# Patient Record
Sex: Male | Born: 1975
Health system: Southern US, Community
[De-identification: ages and names within clinical notes are randomized; demographics above are authoritative.]

## PROBLEM LIST (undated history)

## (undated) DIAGNOSIS — I10 Essential (primary) hypertension: Secondary | ICD-10-CM

## (undated) DIAGNOSIS — G1221 Amyotrophic lateral sclerosis: Secondary | ICD-10-CM

## (undated) DIAGNOSIS — E7800 Pure hypercholesterolemia, unspecified: Secondary | ICD-10-CM

## (undated) HISTORY — PX: APPENDECTOMY: SHX54

## (undated) HISTORY — DX: Essential (primary) hypertension: I10

## (undated) HISTORY — DX: Amyotrophic lateral sclerosis: G12.21

## (undated) HISTORY — DX: Pure hypercholesterolemia, unspecified: E78.00

---

## 2007-10-03 ENCOUNTER — Emergency Department (HOSPITAL_COMMUNITY): Admission: EM | Admit: 2007-10-03 | Discharge: 2007-10-03 | Payer: Self-pay | Admitting: Emergency Medicine

## 2009-06-15 ENCOUNTER — Encounter (INDEPENDENT_AMBULATORY_CARE_PROVIDER_SITE_OTHER): Payer: Self-pay | Admitting: General Surgery

## 2009-06-15 ENCOUNTER — Inpatient Hospital Stay (HOSPITAL_COMMUNITY): Admission: EM | Admit: 2009-06-15 | Discharge: 2009-06-16 | Payer: Self-pay | Admitting: Emergency Medicine

## 2010-04-19 LAB — COMPREHENSIVE METABOLIC PANEL
Alkaline Phosphatase: 73 U/L (ref 39–117)
CO2: 26 mEq/L (ref 19–32)
Calcium: 8.9 mg/dL (ref 8.4–10.5)
Chloride: 103 mEq/L (ref 96–112)
Creatinine, Ser: 1.05 mg/dL (ref 0.4–1.5)
Potassium: 3.9 mEq/L (ref 3.5–5.1)
Sodium: 138 mEq/L (ref 135–145)
Total Bilirubin: 1.2 mg/dL (ref 0.3–1.2)
Total Protein: 7.4 g/dL (ref 6.0–8.3)

## 2010-04-19 LAB — DIFFERENTIAL
Basophils Absolute: 0.1 10*3/uL (ref 0.0–0.1)
Eosinophils Relative: 9 % — ABNORMAL HIGH (ref 0–5)
Lymphocytes Relative: 14 % (ref 12–46)
Monocytes Relative: 5 % (ref 3–12)
Neutrophils Relative %: 71 % (ref 43–77)

## 2010-04-19 LAB — CBC
Hemoglobin: 13.1 g/dL (ref 13.0–17.0)
MCHC: 33.4 g/dL (ref 30.0–36.0)
MCHC: 33.5 g/dL (ref 30.0–36.0)
Platelets: 234 10*3/uL (ref 150–400)
RBC: 4.52 MIL/uL (ref 4.22–5.81)
RDW: 13.4 % (ref 11.5–15.5)
WBC: 10.5 10*3/uL (ref 4.0–10.5)

## 2010-04-19 LAB — URINALYSIS, ROUTINE W REFLEX MICROSCOPIC
Ketones, ur: NEGATIVE mg/dL
Leukocytes, UA: NEGATIVE
Urobilinogen, UA: 1 mg/dL (ref 0.0–1.0)
pH: 8 (ref 5.0–8.0)

## 2010-04-19 LAB — LIPASE, BLOOD: Lipase: 23 U/L (ref 11–59)

## 2010-04-19 LAB — URINE MICROSCOPIC-ADD ON

## 2010-11-03 LAB — URINALYSIS, ROUTINE W REFLEX MICROSCOPIC
Glucose, UA: NEGATIVE
Nitrite: NEGATIVE
Protein, ur: NEGATIVE
Urobilinogen, UA: 1

## 2010-11-03 LAB — RAPID STREP SCREEN (MED CTR MEBANE ONLY): Streptococcus, Group A Screen (Direct): NEGATIVE

## 2012-06-03 ENCOUNTER — Encounter (HOSPITAL_COMMUNITY): Payer: Self-pay | Admitting: *Deleted

## 2012-06-03 ENCOUNTER — Emergency Department (HOSPITAL_COMMUNITY)
Admission: EM | Admit: 2012-06-03 | Discharge: 2012-06-04 | Disposition: A | Discharge status: Home / Self Care | Payer: Self-pay | Attending: Emergency Medicine | Admitting: Emergency Medicine

## 2012-06-03 DIAGNOSIS — R300 Dysuria: Secondary | ICD-10-CM | POA: Insufficient documentation

## 2012-06-03 DIAGNOSIS — Z711 Person with feared health complaint in whom no diagnosis is made: Secondary | ICD-10-CM

## 2012-06-03 DIAGNOSIS — Z202 Contact with and (suspected) exposure to infections with a predominantly sexual mode of transmission: Secondary | ICD-10-CM | POA: Insufficient documentation

## 2012-06-03 DIAGNOSIS — R1030 Lower abdominal pain, unspecified: Secondary | ICD-10-CM

## 2012-06-03 DIAGNOSIS — R109 Unspecified abdominal pain: Secondary | ICD-10-CM | POA: Insufficient documentation

## 2012-06-03 LAB — COMPREHENSIVE METABOLIC PANEL
ALT: 31 U/L (ref 0–53)
AST: 21 U/L (ref 0–37)
Albumin: 4.4 g/dL (ref 3.5–5.2)
BUN: 12 mg/dL (ref 6–23)
Calcium: 9.8 mg/dL (ref 8.4–10.5)
Chloride: 103 mEq/L (ref 96–112)
GFR calc non Af Amer: >90 mL/min (ref >90)
Glucose, Bld: 144 mg/dL — ABNORMAL HIGH (ref 70–99)
Sodium: 139 mEq/L (ref 135–145)
Total Bilirubin: 0.3 mg/dL (ref 0.3–1.2)

## 2012-06-03 LAB — URINALYSIS, ROUTINE W REFLEX MICROSCOPIC
Glucose, UA: NEGATIVE mg/dL
Ketones, ur: NEGATIVE mg/dL
Nitrite: NEGATIVE
Protein, ur: NEGATIVE mg/dL
Specific Gravity, Urine: 1.017 (ref 1.005–1.030)

## 2012-06-03 LAB — CBC WITH DIFFERENTIAL/PLATELET
Eosinophils Relative: 2 % (ref 0–5)
HCT: 41 % (ref 39.0–52.0)
MCV: 83.3 fL (ref 78.0–100.0)
Monocytes Absolute: 0.4 10*3/uL (ref 0.1–1.0)
Monocytes Relative: 4 % (ref 3–12)
Neutro Abs: 5.9 10*3/uL (ref 1.7–7.7)
Platelets: 281 10*3/uL (ref 150–400)
WBC: 10.3 10*3/uL (ref 4.0–10.5)

## 2012-06-03 LAB — LIPASE, BLOOD: Lipase: 30 U/L (ref 11–59)

## 2012-06-03 NOTE — ED Notes (Signed)
C/o lower mid abd pain x 5 days; nausea; last bowel movement today--normal; pain with urination; denies penile discharge

## 2012-06-04 LAB — GC/CHLAMYDIA PROBE AMP: CT Probe RNA: NEGATIVE

## 2012-06-04 MED ORDER — CEFTRIAXONE SODIUM 250 MG IJ SOLR
250.0000 mg | Freq: Once | INTRAMUSCULAR | Status: AC
  Administered 2012-06-04: 250 mg via INTRAMUSCULAR
  Filled 2012-06-04: qty 250

## 2012-06-04 MED ORDER — AZITHROMYCIN 250 MG PO TABS
1000.0000 mg | ORAL_TABLET | Freq: Once | ORAL | Status: AC
  Administered 2012-06-04: 1000 mg via ORAL
  Filled 2012-06-04: qty 4

## 2012-06-04 NOTE — ED Provider Notes (Signed)
History     CSN: [id number]  Arrival date & time 06/03/12  2058   First MD Initiated Contact with Patient 06/04/12 0026      Chief Complaint  Patient presents with  . Abdominal Pain    (Consider location/radiation/quality/duration/timing/severity/associated sxs/prior treatment) HPI Patient emergency department complaining of dysuria and super pubic abdominal pain.  Onset of symptoms began approximately 5 days ago.  Patient denies any penile discharge but does report having unprotected sex about 2 weeks ago.  Patient denies any testicular pain, fevers, night sweats, chills, nausea, vomiting.  Symptoms are moderate.  Unknown exacerbating or alleviating factors. History reviewed. No pertinent past medical history.  Past Surgical History  Procedure Laterality Date  . Appendectomy      No family history on file.  History  Substance Use Topics  . Smoking status: Never Smoker   . Smokeless tobacco: Not on file  . Alcohol Use: Yes     Comment: social      Review of Systems  All other systems reviewed and are negative.    Allergies  Review of patient's allergies indicates no known allergies.  Home Medications  No current outpatient prescriptions on file.  BP 137/83  Pulse 76  Temp(Src) 99.3 F (37.4 C) (Oral)  Resp 20  Ht 5\' 3"  (1.6 m)  Wt 170 lb (77.111 kg)  BMI 30.12 kg/m2  SpO2 100%  Physical Exam  Nursing note and vitals reviewed. Constitutional: He is oriented to person, place, and time. He appears well-developed and well-nourished. No distress.  HENT:  Head: Normocephalic and atraumatic.  Eyes: Conjunctivae and EOM are normal.  Neck: Normal range of motion.  Cardiovascular:  Regular rate rhythm.  Pulmonary/Chest: Effort normal.  Abdominal:  Mild suprapubic abdominal pain.  No peritoneal signs.  Genitourinary:  Exam chaperoned.  Penis normal.  Uncircumcised.  GC Chlamydia cultures obtained.  No testicular swelling or tenderness to palpation.   Musculoskeletal: Normal range of motion.  Neurological: He is alert and oriented to person, place, and time.  Skin: Skin is warm and dry. No rash noted. He is not diaphoretic.  Psychiatric: He has a normal mood and affect. His behavior is normal.    ED Course  Procedures (including critical care time)  Labs Reviewed  COMPREHENSIVE METABOLIC PANEL - Abnormal; Notable for the following:    Glucose, Bld 144 (*)    All other components within normal limits  GC/CHLAMYDIA PROBE AMP  URINALYSIS, ROUTINE W REFLEX MICROSCOPIC  CBC WITH DIFFERENTIAL  LIPASE, BLOOD   No results found.   No diagnosis found.    MDM  Patient to be discharged with instructions to follow up with health department for further STD testing. Discussed importance of using protection when sexually active. Pt understands that they have GC/Chlamydia cultures pending and that they will need to inform all sexual partners if results return positive. Pt has been treated prophylacticly with azithromycin and rocephin due to pts history.  Labs reviewed without acute abnormalities.  Normal vital signs.  Strict return precautions discussed if pain worsened.        Jaci Carrel, [place] 06/04/12 [phone]

## 2012-06-04 NOTE — ED Provider Notes (Signed)
Medical screening examination/treatment/procedure(s) were performed by non-physician practitioner and as supervising physician I was immediately available for consultation/collaboration.  Doug Sou, MD 06/04/12 [phone]

## 2016-05-16 ENCOUNTER — Encounter (HOSPITAL_COMMUNITY): Payer: Self-pay | Admitting: Emergency Medicine

## 2016-05-16 DIAGNOSIS — N342 Other urethritis: Secondary | ICD-10-CM | POA: Insufficient documentation

## 2016-05-16 NOTE — ED Triage Notes (Signed)
Pt friend reporting that pt has been having urinary burning and discharge. Pt friend states that pt had been told to come get checked for STD. Burning began today and discharge 2 days ago.

## 2016-05-17 ENCOUNTER — Emergency Department (HOSPITAL_COMMUNITY)
Admission: EM | Admit: 2016-05-17 | Discharge: 2016-05-17 | Disposition: A | Discharge status: Home / Self Care | Payer: Self-pay | Attending: Emergency Medicine | Admitting: Emergency Medicine

## 2016-05-17 DIAGNOSIS — N342 Other urethritis: Secondary | ICD-10-CM

## 2016-05-17 LAB — GC/CHLAMYDIA PROBE AMP (~~LOC~~) NOT AT ARMC
Chlamydia: NEGATIVE
Neisseria Gonorrhea: NEGATIVE

## 2016-05-17 LAB — URINALYSIS, ROUTINE W REFLEX MICROSCOPIC
Bilirubin Urine: NEGATIVE
GLUCOSE, UA: NEGATIVE mg/dL
HGB URINE DIPSTICK: NEGATIVE
Ketones, ur: NEGATIVE mg/dL
LEUKOCYTES UA: NEGATIVE
Nitrite: NEGATIVE
PH: 5 (ref 5.0–8.0)
Protein, ur: NEGATIVE mg/dL
SPECIFIC GRAVITY, URINE: 1.023 (ref 1.005–1.030)

## 2016-05-17 MED ORDER — CEFTRIAXONE SODIUM 250 MG IJ SOLR
250.0000 mg | Freq: Once | INTRAMUSCULAR | Status: AC
Start: 2016-05-17 — End: 2016-05-17
  Administered 2016-05-17: 250 mg via INTRAMUSCULAR
  Filled 2016-05-17: qty 250

## 2016-05-17 MED ORDER — LIDOCAINE HCL 1 % IJ SOLN
INTRAMUSCULAR | Status: AC
  Administered 2016-05-17: 1.2 mL
  Filled 2016-05-17: qty 20

## 2016-05-17 MED ORDER — METRONIDAZOLE 500 MG PO TABS
2000.0000 mg | ORAL_TABLET | Freq: Once | ORAL | Status: AC
  Administered 2016-05-17: 2000 mg via ORAL
  Filled 2016-05-17: qty 4

## 2016-05-17 MED ORDER — AZITHROMYCIN 250 MG PO TABS
1000.0000 mg | ORAL_TABLET | Freq: Once | ORAL | Status: AC
  Administered 2016-05-17: 1000 mg via ORAL
  Filled 2016-05-17: qty 4

## 2016-05-17 MED ORDER — ONDANSETRON 8 MG PO TBDP
8.0000 mg | ORAL_TABLET | Freq: Once | ORAL | Status: AC
  Administered 2016-05-17: 8 mg via ORAL
  Filled 2016-05-17: qty 1

## 2016-05-17 NOTE — Discharge Instructions (Signed)
No intercourse for 1 week. We treated you today for possible trichomonas, gonorrhea, chlamydia.  Your urine analysis is normal. Follow up with health department.

## 2016-05-17 NOTE — ED Provider Notes (Signed)
[facility]-EMERGENCY DEPT Provider Note   CSN: [id number] Arrival date & time: 05/16/16  2253     History   Chief Complaint Chief Complaint  Patient presents with  . Dysuria    HPI Cody Fleming is a 41 y.o. male.  HPI Cody Fleming is a 41 y.o. male presents to ED with complaint of penile discharge, dysuria, urinary urgency. Symptoms began 2 days ago. No abdominal pain, pain in his penis, scrotum, no flank pain. No n/v. His girlfriend told him she got diagnosed with trichomonas. Pt is here to be treated.   History reviewed. No pertinent past medical history.  There are no active problems to display for this patient.   Past Surgical History:  Procedure Laterality Date  . APPENDECTOMY         Home Medications    Prior to Admission medications   Not on File    Family History History reviewed. No pertinent family history.  Social History Social History  Substance Use Topics  . Smoking status: Never Smoker  . Smokeless tobacco: Not on file  . Alcohol use Yes     Comment: social     Allergies   Patient has no known allergies.   Review of Systems Review of Systems  Constitutional: Negative for chills and fever.  Gastrointestinal: Negative for abdominal pain.  Genitourinary: Positive for discharge, dysuria and urgency. Negative for flank pain, frequency, genital sores, hematuria, penile pain and penile swelling.  All other systems reviewed and are negative.    Physical Exam Updated Vital Signs BP (!) 141/91 (BP Location: Left Arm)   Pulse 71   Temp 98.1 F (36.7 C) (Oral)   Resp 18   Ht  (1.6 m)   Wt 76.7 kg   SpO2 98%   BMI 29.94 kg/m   Physical Exam  Constitutional: He appears well-developed and well-nourished. No distress.  HENT:  Head: Normocephalic and atraumatic.  Eyes: Conjunctivae are normal.  Neck: Neck supple.  Cardiovascular: Normal rate, regular rhythm and normal heart sounds.   Pulmonary/Chest: Effort normal.  No respiratory distress. He has no wheezes. He has no rales.  Abdominal: Soft. Bowel sounds are normal. He exhibits no distension. There is no tenderness. There is no rebound.  Musculoskeletal: He exhibits no edema.  Neurological: He is alert.  Skin: Skin is warm and dry.  Nursing note and vitals reviewed.    ED Treatments / Results  Labs (all labs ordered are listed, but only abnormal results are displayed) Labs Reviewed  URINALYSIS, ROUTINE W REFLEX MICROSCOPIC    EKG  EKG Interpretation None       Radiology No results found.  Procedures Procedures (including critical care time)  Medications Ordered in ED Medications  ondansetron (ZOFRAN-ODT) disintegrating tablet 8 mg (8 mg Oral Given 05/17/16 0630)  metroNIDAZOLE (FLAGYL) tablet 2,000 mg (2,000 mg Oral Given 05/17/16 0629)  azithromycin (ZITHROMAX) tablet 1,000 mg (1,000 mg Oral Given 05/17/16 0630)  cefTRIAXone (ROCEPHIN) injection 250 mg (250 mg Intramuscular Given 05/17/16 7829)  lidocaine (XYLOCAINE) 1 % (with pres) injection (1.2 mLs  Given 05/17/16 5621)     Initial Impression / Assessment and Plan / ED Course  I have reviewed the triage vital signs and the nursing notes.  Pertinent labs & imaging results that were available during my care of the patient were reviewed by me and considered in my medical decision making (see chart for details).     Patient denies emergency department for penile discharge, dysuria, symptoms onset 2  days ago. Patient's girlfriend told him that she was diagnosed with trichomonas. Patient's urinalysis is normal. We will treat him for STI. He is not having any penile pain or swelling, no scrotum pain, no abdominal flank pain. No nausea or vomiting. No concern for epididymitis or pyelonephritis. Treated him with Rocephin 250 mg IM, Zithromax 1 g by mouth, Flagyl 2 g by mouth. Instructed to abstain from intercourse for one week, follow up with health department.  Vitals:   05/16/16 2306  05/16/16 2307 05/17/16 0621  BP: (!) 133/92  (!) 141/91  Pulse: 79  71  Resp: 18    Temp: 98.9 F (37.2 C)  98.1 F (36.7 C)  TempSrc: Oral  Oral  SpO2: 98%  98%  Weight:  76.7 kg   Height:   (1.6 m)      Final Clinical Impressions(s) / ED Diagnoses   Final diagnoses:  None    New Prescriptions New Prescriptions   No medications on file     Iona Coach 05/17/16 1610    April Palumbo, MD 05/17/16 [phone]

## 2016-05-17 NOTE — ED Notes (Signed)
Discharge instructions and follow up care reviewed with patient. Patient verbalized understanding. 

## 2017-05-10 ENCOUNTER — Ambulatory Visit: Payer: Self-pay | Admitting: Diagnostic Neuroimaging

## 2017-05-10 ENCOUNTER — Encounter: Payer: Self-pay | Admitting: Diagnostic Neuroimaging

## 2017-05-10 ENCOUNTER — Telehealth: Payer: Self-pay | Admitting: Diagnostic Neuroimaging

## 2017-05-10 DIAGNOSIS — G959 Disease of spinal cord, unspecified: Secondary | ICD-10-CM

## 2017-05-10 DIAGNOSIS — R29818 Other symptoms and signs involving the nervous system: Secondary | ICD-10-CM

## 2017-05-10 NOTE — Telephone Encounter (Signed)
self pay order sent ot [facility] they will contact the pt to schedule.

## 2017-05-10 NOTE — Patient Instructions (Signed)
-   check MRI brain scan

## 2017-05-10 NOTE — Progress Notes (Signed)
[facility]  PATIENT: Cody Fleming DOB: 1975-12-31  REFERRING CLINICIAN: Ishmael Holter HISTORY FROM: patient and friend (via interpreter) REASON FOR VISIT: new consult    HISTORICAL  CHIEF COMPLAINT:  Chief Complaint  Patient presents with  . LUE numbness, weakness    rm 6, New Pt, interpreter Peppe Delatore, friend- Judeth Cornfield, "problems with left arm/hand x 5 months; no history of injury"      HISTORY OF PRESENT ILLNESS:   42 year old male here for evaluation of left upper extremity numbness and weakness.  For past 5 months patient has had twitching in the left deltoid, which progressed down to the left hand.  Also with numbness and weakness in the left hand.  For past 1 year patient has had cramps in his left leg, with numbness and weakness.  He has some difficulty walking.  Patient is also had about a few months of blurred vision sensation.  He has some numbness on the left side of his face.    REVIEW OF SYSTEMS: Full 14 system review of systems performed and negative with exception of: Memory loss confusion headache numbness slurred speech dizziness tremor insomnia restless legs decreased energy joint pain cramps incontinence blurred vision palpitations impotence urination problems.  ALLERGIES: Allergies  Allergen Reactions  . Other Nausea And Vomiting    IV contrast dye    HOME MEDICATIONS: No outpatient medications prior to visit.   No facility-administered medications prior to visit.     PAST MEDICAL HISTORY: Past Medical History:  Diagnosis Date  . Hypertension     PAST SURGICAL HISTORY: Past Surgical History:  Procedure Laterality Date  . APPENDECTOMY      FAMILY HISTORY: Family History  Problem Relation Age of Onset  . Diabetes Mother     SOCIAL HISTORY:  Social History   Socioeconomic History  . Marital status: Single    Spouse name: Not on file  . Number of children: 0  . Years of education: 5  .  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 Financial resource strain: Not on file  . Food insecurity:    Worry: Not on file    Inability: Not on file  . Transportation needs:    Medical: Not on file    Non-medical: Not on file  Tobacco Use  . Smoking status: Never Smoker  . Smokeless tobacco: Never Used  . Tobacco comment: 05/10/17 rarely  Substance and Sexual Activity  . Alcohol use: Yes    Comment: social  . Drug use: Never  . Sexual activity: Not on file  Lifestyle  . Physical activity:    Days per week: Not on file    Minutes per session: Not on file  . Stress: Not on file  Relationships  . Social connections:    Talks on phone: Not on file    Gets together: Not on file    Attends religious service: Not on file    Active member of club or organization: Not on file    Attends meetings of clubs or organizations: Not on file    Relationship status: Not on file  . Intimate partner violence:    Fear of current or ex partner: Not on file    Emotionally abused: Not on file    Physically abused: Not on file    Forced sexual activity: Not on file  Other Topics Concern  . Not on file  Social  History Narrative   Lives sister   Restaurant cook, [employer]   Children none   Education- 5th grade      PHYSICAL EXAM   GENERAL EXAM/CONSTITUTIONAL: Vitals:  Vitals:   05/10/17 0845  BP: 137/90  Pulse: 74  Weight: 162 lb (73.5 kg)  Height: 5\' 3"  (1.6 m)     Body mass index is 28.7 kg/m.  Vision Screening Comments: Wears glasses, unable to complete screen  Patient is in no distress; well developed, nourished and groomed; neck is supple  CARDIOVASCULAR:  Examination of carotid arteries is normal; no carotid bruits  Regular rate and rhythm, no murmurs  Examination of peripheral vascular system by observation and palpation is normal  EYES:  Ophthalmoscopic exam of optic discs and  posterior segments is normal; no papilledema or hemorrhages  MUSCULOSKELETAL:  Gait, strength, tone, movements noted in Neurologic exam below  NEUROLOGIC: MENTAL STATUS:  No flowsheet data found.  awake, alert, oriented to person, place and time  recent and remote memory intact  normal attention and concentration  language fluent, comprehension intact, naming intact,   fund of knowledge appropriate  CRANIAL NERVE:   2nd - no papilledema on fundoscopic exam  2nd, 3rd, 4th, 6th - pupils equal and reactive to light, visual fields full to confrontation, extraocular muscles intact, no nystagmus  5th - facial sensation --> DECR PP IN LEFT FACE (V1, V2, V3)  7th - facial strength symmetric  8th - hearing intact  9th - palate elevates symmetrically, uvula midline  11th - shoulder shrug symmetric  12th - tongue protrusion midline  MOTOR:   normal bulk and tone, full strength in the RUE, RLE  LUE (DELTOID 4, BICEPT 4, TRICEP 3, FINGER ABDUCTION 2-3, GRIP 4, FINGER EXT 2); LEFT FDI ATROPHY  LLE HIP FLEX 4+, KNEE EXT/FLEX 4, DF 5  SENSORY:   normal and symmetric to light touch  DECR PP IN LUE AND LLE  DECR VIB IN LEFT FOOT  COORDINATION:   finger-nose-finger, fine finger movements --> DYSMETRIA WITH LUE  REFLEXES:   deep tendon reflexes present --> BRISK IN BUE AND BLE; LEFT MORE THAN RIGHT  POSITIVE PECTORAL, SUPRAPATELLAR REFLEXES  GAIT/STATION:   narrow based gait; SLIGHT LEFT HEMIPARETIC / SPASTIC GAIT    DIAGNOSTIC DATA (LABS, IMAGING, TESTING) - I reviewed patient records, labs, notes, testing and imaging myself where available.  Lab Results  Component Value Date   WBC 10.3 06/03/2012   HGB 14.0 06/03/2012   HCT 41.0 06/03/2012   MCV 83.3 06/03/2012   PLT 281 06/03/2012      Component Value Date/Time   NA 139 06/03/2012 2136   K 3.7 06/03/2012 2136   CL 103 06/03/2012 2136   CO2 28 06/03/2012 2136   GLUCOSE 144 (H) 06/03/2012 2136    BUN 12 06/03/2012 2136   CREATININE 0.95 06/03/2012 2136   CALCIUM 9.8 06/03/2012 2136   PROT 7.9 06/03/2012 2136   ALBUMIN 4.4 06/03/2012 2136   AST 21 06/03/2012 2136   ALT 31 06/03/2012 2136   ALKPHOS 81 06/03/2012 2136   BILITOT 0.3 06/03/2012 2136   GFRNONAA >90 06/03/2012 2136   GFRAA >90 06/03/2012 2136   No results found for: CHOL, HDL, LDLCALC, LDLDIRECT, TRIG, CHOLHDL No results found for: ZOXW9UHGBA1C No results found for: VITAMINB12 No results found for: TSH      ASSESSMENT AND PLAN  42 y.o. year old male here with at least one year of left leg numbness and weakness, 5  months of left arm numbness and weakness, subacute blurred vision, memory loss, incontinence, impotence over the past year.  Signs and symptoms concerning for central nervous system localization and multifocal process.  Recommend to check MRI of the brain to start workup.  And may consider MRI cervical and thoracic spine.   Localizations: right brain, spinal cord, optic nerve  Dx: CNS autoimmune, inflamm, vascular  1. Disease of spinal cord (HCC)   2. Other symptoms and signs involving the nervous system      PLAN:  - check MRI brain w/wo - then may consider MRI cervical / thoracic spine - then may consider additional lab testing  Orders Placed This Encounter  Procedures  . MR BRAIN W WO CONTRAST   Return in about 6 months (around 11/09/2017).    Suanne Marker, MD 05/10/2017, 9:42 AM Certified in Neurology, Neurophysiology and Neuroimaging  [facility] [address], [address] [place], [place] [address] [phone]

## 2017-05-17 ENCOUNTER — Other Ambulatory Visit: Payer: Self-pay

## 2017-05-20 ENCOUNTER — Other Ambulatory Visit: Payer: Self-pay

## 2017-05-24 ENCOUNTER — Other Ambulatory Visit: Payer: Self-pay

## 2017-05-31 ENCOUNTER — Ambulatory Visit
Admission: RE | Admit: 2017-05-31 | Discharge: 2017-05-31 | Disposition: A | Discharge status: Home / Self Care | Payer: No Typology Code available for payment source | Source: Ambulatory Visit | Attending: Diagnostic Neuroimaging | Admitting: Diagnostic Neuroimaging

## 2017-05-31 DIAGNOSIS — R202 Paresthesia of skin: Secondary | ICD-10-CM

## 2017-05-31 DIAGNOSIS — R29818 Other symptoms and signs involving the nervous system: Secondary | ICD-10-CM

## 2017-05-31 DIAGNOSIS — G959 Disease of spinal cord, unspecified: Secondary | ICD-10-CM

## 2017-05-31 MED ORDER — GADOBENATE DIMEGLUMINE 529 MG/ML IV SOLN
15.0000 mL | Freq: Once | INTRAVENOUS | Status: AC | PRN
  Administered 2017-05-31: 15 mL via INTRAVENOUS

## 2017-06-08 ENCOUNTER — Telehealth: Payer: Self-pay | Admitting: Neurology

## 2017-06-08 DIAGNOSIS — M5412 Radiculopathy, cervical region: Secondary | ICD-10-CM

## 2017-06-08 NOTE — Telephone Encounter (Signed)
Called to make pt aware that MRI c-spine was ordered. Voicemail not set up.

## 2017-06-08 NOTE — Telephone Encounter (Signed)
I ordered MRI cervical spine. -VRP 

## 2017-06-08 NOTE — Addendum Note (Signed)
Addended by: Joycelyn Schmid R on: 06/08/2017 04:43 PM   Modules accepted: Orders

## 2017-06-08 NOTE — Telephone Encounter (Signed)
Attempted to call the patient. His voicemail is not set up. Unable to leave a message. Will have to call again.

## 2017-06-08 NOTE — Telephone Encounter (Signed)
-----   Message from Suanne Marker, MD sent at 06/05/2017 12:49 PM EDT ----- Unremarkable imaging results. Please call patient. Continue current plan. May need to consider MRI cervical spine next. -VRP

## 2017-06-08 NOTE — Telephone Encounter (Signed)
Spoke with patient and discussed MRI results and plan of care. He handed the phone to his friend Judeth Cornfield for me to explain again and she translated to him. They are aware that the MRI brain is unremarkable and to continue current plan. Patient asked if his arm had anything to do with cervical spine. RN advised that would be the next thing to consider and he said that was fine per interpretation from friend.  They are aware that message will be passed to Dr. Marjory Lies to see if this should be ordered now or later. She verbalized understanding and appreciation.

## 2017-06-09 ENCOUNTER — Telehealth: Payer: Self-pay | Admitting: Diagnostic Neuroimaging

## 2017-06-09 NOTE — Telephone Encounter (Signed)
Attempted to call pt once more to inform him that MRI was ordered. Unable to reach and voicemail was not set up.

## 2017-06-09 NOTE — Telephone Encounter (Signed)
Called patient with [employer] spanish language and informed him that Dr. Marjory Lies ordered the MRI of his neck and someone else will be calling him to make the appointment. He verbalized understanding and appreciation.

## 2017-06-09 NOTE — Telephone Encounter (Signed)
self pay order sent to [facility]. They will reach out to the pt schedule.

## 2017-06-14 ENCOUNTER — Ambulatory Visit
Admission: RE | Admit: 2017-06-14 | Discharge: 2017-06-14 | Disposition: A | Discharge status: Home / Self Care | Payer: Self-pay | Source: Ambulatory Visit | Attending: Diagnostic Neuroimaging | Admitting: Diagnostic Neuroimaging

## 2017-06-14 DIAGNOSIS — M5412 Radiculopathy, cervical region: Secondary | ICD-10-CM

## 2017-06-14 MED ORDER — GADOBENATE DIMEGLUMINE 529 MG/ML IV SOLN
15.0000 mL | Freq: Once | INTRAVENOUS | Status: AC | PRN
  Administered 2017-06-14: 15 mL via INTRAVENOUS

## 2017-06-19 ENCOUNTER — Telehealth: Payer: Self-pay | Admitting: *Deleted

## 2017-06-19 NOTE — Telephone Encounter (Signed)
-----   Message from Suanne Marker, MD sent at 06/16/2017 12:54 PM EDT ----- Unremarkable imaging results. Please call patient. May consider EMG/NCS as next step. -VRP

## 2017-06-19 NOTE — Telephone Encounter (Signed)
Relayed both the MRI brain and cervical MRi as unremarkable. Next step [facility], made appt for 07-27-17 at 1315 arrive 1245,  These results and appt set up bu [place], [id number] [facility] interpreters.  Pt verbalized understanding.  If cancellation can call him to see if can come sooner.

## 2017-07-27 ENCOUNTER — Encounter: Payer: Self-pay | Admitting: Diagnostic Neuroimaging

## 2017-07-27 ENCOUNTER — Ambulatory Visit (INDEPENDENT_AMBULATORY_CARE_PROVIDER_SITE_OTHER): Payer: Self-pay | Admitting: Diagnostic Neuroimaging

## 2017-07-27 DIAGNOSIS — Z0289 Encounter for other administrative examinations: Secondary | ICD-10-CM

## 2017-07-27 DIAGNOSIS — M6281 Muscle weakness (generalized): Secondary | ICD-10-CM

## 2017-07-27 DIAGNOSIS — G589 Mononeuropathy, unspecified: Secondary | ICD-10-CM

## 2017-07-27 DIAGNOSIS — G6289 Other specified polyneuropathies: Secondary | ICD-10-CM

## 2017-07-27 NOTE — Progress Notes (Signed)
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 DG FLUORO GUIDED LOC OF NEEDLE/CATH TIP FOR SPINAL INJECT LT  . Aldolase  . CK  . Vitamin B12  . Hemoglobin A1c  . TSH  . Multiple Myeloma Panel (SPEP&IFE w/QIG)  . ANA,IFA RA Diag Pnl w/rflx Tit/Patn  . GM1 Antibody IgG, IgM  . GQ1b Antibody IgG    Penni Bombard, MD 05/25/9561, 8:75 PM Certified in Neurology, Neurophysiology and Neuroimaging  [facility] [address] [place], [place] [address] [phone]

## 2017-08-02 NOTE — Procedures (Signed)
[facility]  NCS (NERVE CONDUCTION STUDY) WITH EMG (ELECTROMYOGRAPHY) REPORT   STUDY DATE: 07/27/17 PATIENT NAME: Cody Fleming DOB: 04-12-1975 MRN: [id number]  ORDERING CLINICIAN: Joycelyn SchmidVikram Penumalli, MD   TECHNOLOGIST: Charlesetta IvoryBeau Handy  ELECTROMYOGRAPHER: Glenford BayleyVikram R. Penumalli, MD  CLINICAL INFORMATION: 42 year old male with left arm and left leg numbness and weakness. Hyper-reflexia noted.   FINDINGS: NERVE CONDUCTION STUDY:  Bilateral median and left ulnar motor responses of normal distal latencies, decreased amplitudes, normal conduction velocities.  Right ulnar motor response is normal.  Bilateral peroneal and tibial motor responses are normal.  Bilateral median, ulnar, superficial peroneal and sural sensory responses are normal.   NEEDLE ELECTROMYOGRAPHY:  Needle examination of bilateral upper and lower extremities, cervical, thoracic, lumbar paraspinal muscles, genioglossus, was performed.  Abnormal spontaneous activity noted in multiple muscles of bilateral upper and lower extremities including fibrillation potentials, positive sharp waves.  Fasciculations noted in the left deltoid, right flexor carpi radialis, left thoracic paraspinal muscles.  Neurogenic recruitment pattern noted in multiple muscles as an EMG table, in bilateral upper and lower extremity's as well as right genioglossus.  Myopathic recruitment pattern noted in left deltoid.    IMPRESSION:   Abnormal study demonstrating: - Bilateral median and left ulnar motor responses have decreased amplitudes. - Active and chronic denervation affecting bilateral upper and lower extremities. - Chronic denervation affecting right genioglossus. - Occasional fasciculations noted in bilateral upper extremities and left thoracic paraspinal muscles. - Mild myopathic recruitment pattern affecting left deltoid muscle. 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 Underlying myopathy is possible but less likely.      INTERPRETING PHYSICIAN:  Suanne MarkerVIKRAM R. PENUMALLI, MD Certified in Neurology, Neurophysiology and Neuroimaging  [facility] [address], [address] [place], [place] [address] [phone]   [facility]    Nerve / Sites Muscle Latency Ref. Amplitude Ref. Rel Amp Segments Distance Velocity Ref. Area    ms ms mV mV %  cm m/s m/s mVms  R Median - APB     Wrist APB 3.5 ?4.4 3.2 ?4.0 100 Wrist - APB 7   10.4     Upper arm APB 7.1  3.2  98.4 Upper arm - Wrist 20 56 ?49 10.6  L Median - APB     Wrist APB 3.4 ?4.4 2.5 ?4.0 100 Wrist - APB 7   7.3     Upper arm APB 7.1  2.4  94.4 Upper arm - Wrist 20 54 ?49 7.0  R Ulnar - ADM     Wrist ADM 2.6 ?3.3 6.5 ?6.0 100 Wrist - ADM 7   17.3     B.Elbow ADM 5.8  6.0  91.8 B.Elbow - Wrist 19 60 ?49 16.5     A.Elbow ADM 7.2  5.8  97.3 A.Elbow - B.Elbow 9 62 ?49 15.9         A.Elbow - Wrist      L Ulnar - ADM     Wrist ADM 2.7 ?3.3 2.6 ?6.0 100 Wrist - ADM 7   9.7     B.Elbow ADM 6.2  2.4  92.2 B.Elbow - Wrist 19 54 ?49 9.1     A.Elbow ADM 7.9  2.3  98.1 A.Elbow - B.Elbow 10 58 ?49 9.0         A.Elbow - Wrist      R Peroneal - EDB     Ankle EDB 4.0 ?6.5 2.4 ?2.0 100 Ankle - EDB  9   7.0     Fib head EDB 9.2  1.8  77.3 Fib head - Ankle 27 52 ?44 5.4     Pop fossa EDB 11.3  1.7  90.1 Pop fossa - Fib head 10 48 ?44 4.9     Acc Peron EDB      Pop fossa - Ankle             Acc Peron - Pop fossa      L Peroneal - EDB     Ankle EDB 5.2 ?6.5 5.9 ?2.0 100 Ankle - EDB 9   18.1     Fib head EDB 10.4  5.3  88.7 Fib head - Ankle 27 52 ?44 16.7     Pop fossa EDB 12.3  5.1  96.6 Pop fossa - Fib head 10 53 ?44 16.1         Pop fossa - Ankle      R Tibial - AH     Ankle AH 4.2 ?5.8 12.1 ?4.0 100 Ankle - AH 9   25.3     Pop fossa AH 11.3  9.0  74.6 Pop fossa - Ankle 33 46 ?41 20.7  L Tibial - AH     Ankle AH 4.1 ?5.8 15.3 ?4.0 100 Ankle - AH 9   30.5     Pop fossa AH 11.3  11.8   77.3 Pop fossa - Ankle 33 46 ?41 26.3                     SNC    Nerve / Sites Rec. Site Peak Lat Ref.  Amp Ref. Segments Distance    ms ms V V  cm  R Sural - Ankle (Calf)     Calf Ankle 3.5 ?4.4 17 ?6 Calf - Ankle 14  L Sural - Ankle (Calf)     Calf Ankle 3.8 ?4.4 16 ?6 Calf - Ankle 14  R Superficial peroneal - Ankle     Lat leg Ankle 3.6 ?4.4 18 ?6 Lat leg - Ankle 14  L Superficial peroneal - Ankle     Lat leg Ankle 3.8 ?4.4 16 ?6 Lat leg - Ankle 14  R Median - Orthodromic (Dig II, Mid palm)     Dig II Wrist 2.7 ?3.4 16 ?10 Dig II - Wrist 13  L Median - Orthodromic (Dig II, Mid palm)     Dig II Wrist 2.5 ?3.4 43 ?10 Dig II - Wrist 13  R Ulnar - Orthodromic, (Dig V, Mid palm)     Dig V Wrist 2.3 ?3.1 18 ?5 Dig V - Wrist 11  L Ulnar - Orthodromic, (Dig V, Mid palm)     Dig V Wrist 2.4 ?3.1 14 ?5 Dig V - Wrist 15                     F  Wave    Nerve F Lat Ref.   ms ms  R Tibial - AH 45.8 ?56.0  L Tibial - AH 44.8 ?56.0  R Ulnar - ADM 25.9 ?32.0  L Ulnar - ADM 28.3 ?32.0             EMG full       EMG Summary Table    Spontaneous MUAP Recruitment  Muscle IA Fib PSW Fasc Other Amp Dur. Poly Pattern  L. Deltoid Normal 1+ 1+ Occasional _______ Normal Normal 1+ Early  L. Biceps brachii Increased 2+ 2+  None CRDs Normal Normal 1+ Reduced  L. Triceps brachii Normal 1+ 1+ None _______ Increased Normal Normal Reduced  L. Flexor carpi radialis Normal 1+ 1+ None _______ Increased Normal Normal Reduced  L. First dorsal interosseous Normal 2+ 2+ None _______ Normal Normal Normal No Activity  L. Vastus medialis Increased None None None _______ Normal Normal Normal Reduced  L. Tibialis anterior Normal None None None _______ Normal Normal Normal Normal  L. Gastrocnemius (Medial head) Normal 1+ 1+ None _______ Normal Normal Normal Normal  R. Tibialis anterior Normal 2+ 2+ None _______ Increased Normal Normal Reduced  R. Gastrocnemius (Medial head) Normal 1+ 1+ None _______ Normal Normal  Normal Normal  R. Deltoid Normal 1+ 1+ None _______ Increased Normal Normal Reduced  R. Biceps brachii Normal 1+ 1+ None _______ Increased Normal Normal Reduced  R. Triceps brachii Normal 1+ 1+ Rare _______ Increased Normal Normal Reduced  R. Flexor carpi radialis Normal None None Occasional _______ Normal Normal Normal Normal  R. First dorsal interosseous Normal None None None _______ Normal Normal Normal Normal  R. Cervical paraspinals Normal None None Rare _______ Normal Normal Normal Normal  R. Thoracic paraspinals Normal None None Rare _______ Normal Normal Normal Normal  R. Lumbar paraspinals Normal 1+ None None _______ Normal Normal Normal Normal  L. Thoracic paraspinals Normal None None Occasional _______ Normal Normal Normal Normal  R. Genioglossus Normal None None None _______ Increased Normal Normal Reduced

## 2017-08-10 ENCOUNTER — Telehealth: Payer: Self-pay | Admitting: *Deleted

## 2017-08-10 ENCOUNTER — Ambulatory Visit
Admission: RE | Admit: 2017-08-10 | Discharge: 2017-08-10 | Disposition: A | Discharge status: Home / Self Care | Payer: Self-pay | Source: Ambulatory Visit | Attending: Diagnostic Neuroimaging | Admitting: Diagnostic Neuroimaging

## 2017-08-10 VITALS — BP 133/75 | HR 60

## 2017-08-10 DIAGNOSIS — M6281 Muscle weakness (generalized): Secondary | ICD-10-CM

## 2017-08-10 DIAGNOSIS — G6289 Other specified polyneuropathies: Secondary | ICD-10-CM

## 2017-08-10 NOTE — Discharge Instructions (Signed)
Punción lumbar  (Lumbar Puncture)  La punción lumbar es un procedimiento mediante el cual se extrae y examina una pequeña cantidad del líquido que rodea el cerebro y la médula espinal. Este líquido se llama líquido cefalorraquídeo. Este procedimiento se puede realizar para lo siguiente:  · Ayudar a diagnosticar varios problemas, como la meningitis, la encefalitis, la esclerosis múltiple y el sida.  · Drenar líquido y aliviar la presión que se presenta con ciertos tipos de cefaleas.  · Detectar hemorragias en las zonas del cerebro y la médula espinal (sistema nervioso central).  · Administrar medicamentos en el líquido cefalorraquídeo.  INFORME A SU MÉDICO:  · Cualquier alergia que tenga.  · Todos los medicamentos que utiliza, incluidos vitaminas, hierbas, gotas oftálmicas, cremas y medicamentos de venta libre.  · Problemas previos que usted o los miembros de su familia hayan tenido con el uso de anestésicos.  · Enfermedades de la sangre.  · Cirugías previas.  · Enfermedades.    RIESGOS Y COMPLICACIONES  En general, se trata de un procedimiento seguro. Sin embargo, como en cualquier procedimiento, pueden surgir complicaciones. Las complicaciones posibles son:  · Cefalea por punción lumbar. Es una cefalea grave que se presenta cuando hay pérdida de líquido cefalorraquídeo. Una cefalea por punción lumbar provoca malestar pero no es peligrosa. Si persiste, se puede realizar otro procedimiento para tratar la cefalea.  · Hemorragias. Generalmente ocurre en personas con trastornos hemorrágicos. Se trata de enfermedades en las que la sangre no coagula normalmente.  · Infección en el lugar de la inserción que se puede extender hasta el hueso o el líquido cefalorraquídeo.  · Formación de un tumor en la médula espinal (poco frecuente).  · Hernia cerebral o desplazamiento del cerebro a la médula espinal (poco frecuente).  · Imposibilidad para moverse (extremadamente raro).  ANTES DEL PROCEDIMIENTO  · Posiblemente deba  realizarse análisis de sangre. Estos análisis pueden ayudar a determinar la eficacia con la que están funcionando los riñones y el hígado. También pueden determinar la eficacia con la que coagula la sangre.  · Si toma anticoagulantes, pregúntele al médico si debe dejar de tomarlos y cuándo.  · El médico puede indicarle que se realice una tomografía computarizada (TC) del cerebro.  · Pídale a alguna persona que lo lleve a su casa después del procedimiento.    PROCEDIMIENTO  · Se lo colocará en una posición que permita que los espacios entre los huesos de la columna (vértebras) se separen todo lo posible. Esta posición facilitará el pasaje de la aguja hasta el canal espinal.  · Según su edad y tamaño, puede yacer de costado, con las rodillas cerca del mentón. O bien, puede sentarse con la cabeza sobre una almohada colocada a la altura de la cintura.  · Se limpiará la piel que recubre la parte inferior de la espalda (o región lumbar).  · Posiblemente se adormezca la piel con medicamentos.  · Probablemente le administren un medicamento para el dolor o para ayudarlo a relajarse (sedante).  · Luego se inserta una pequeña aguja, hasta que ingresa en el espacio que contiene el líquido cefalorraquídeo. La aguja no ingresará a la médula espinal.  · El líquido cefalorraquídeo se recolectará en tubos.  · Se retirará la aguja y se colocará un apósito en la zona.    DESPUÉS DEL PROCEDIMIENTO  · Permanecerá recostado durante 1 hora o durante el tiempo que sugiera el médico.  · El líquido cefalorraquídeo se enviará a un laboratorio para que sea examinado. Los resultados del   examen posiblemente estén listos antes de que se vaya a su casa.  · Si el médico cree que tiene una infección, se extraerá material del líquido cefalorraquídeo para hacer una prueba llamada cultivo. Si se extrajo un cultivo para exámenes, los resultados generalmente estarán listos en un par de días.    Esta información no tiene como fin reemplazar el consejo del  médico. Asegúrese de hacerle al médico cualquier pregunta que tenga.  Document Released: 10/27/2004 Document Revised: 11/07/2012 Document Reviewed: 09/24/2012  Elsevier Interactive Patient Education © 2017 Elsevier Inc.

## 2017-08-10 NOTE — Progress Notes (Signed)
Blood obtained from L AC for LP labs. Pt tolerated procedure well. Site is unremarkable. 

## 2017-08-10 NOTE — Telephone Encounter (Signed)
Received call [facility] about Preliminary CSF results, and  to fax final CSF results to [phone] unless critical will call.  [facility]. (See Epic labs)

## 2017-08-15 ENCOUNTER — Ambulatory Visit (HOSPITAL_COMMUNITY)
Admission: EM | Admit: 2017-08-15 | Discharge: 2017-08-15 | Disposition: A | Discharge status: Home / Self Care | Payer: Self-pay

## 2017-08-15 ENCOUNTER — Telehealth: Payer: Self-pay | Admitting: *Deleted

## 2017-08-15 ENCOUNTER — Other Ambulatory Visit: Payer: Self-pay

## 2017-08-15 ENCOUNTER — Encounter (HOSPITAL_COMMUNITY): Payer: Self-pay | Admitting: Emergency Medicine

## 2017-08-15 DIAGNOSIS — R51 Headache: Secondary | ICD-10-CM

## 2017-08-15 DIAGNOSIS — R519 Headache, unspecified: Secondary | ICD-10-CM

## 2017-08-15 NOTE — Telephone Encounter (Signed)
-----   Message from Vikram R Penumalli, MD sent at 08/14/2017  3:11 PM EDT ----- Slightly elevated CK (could represent muscle disease). Consider muscle biopsy [facility]. Please call patient. -VRP 

## 2017-08-15 NOTE — ED Provider Notes (Signed)
[facility]    CSN: [id number] Arrival date & time: 08/15/17  0801     History   Chief Complaint Chief Complaint  Patient presents with  . Headache    HPI Davide Thurmon is a 42 y.o. male with past medical history of hypertension..   Patient is a 42 year old male with a history of hypertension that presents with 3 days of headache post lumbar puncture that was performed on 08/10/2017.  The headache started Saturday with vomiting and continued into Sunday with vomiting.  He has had some blurred vision associated with it.  He has been taking ibuprofen to ease the pain and reports Monday and today the headache has gotten better.  He describes the pain as throbbing located in both of his temples and pulsating with mild photophobia.  He is currently being worked up by neurology for weakness and numbness in his left arm.  He has had MRI of the brain and cervical spine which were normal.  Lumbar puncture showed rare white blood cells.  He denies any fever, chills.  He is complaining of mild neck pain.  Denies stiffness in neck.  All this information was obtained using the translator.  ROS per HPI      Past Medical History:  Diagnosis Date  . Hypertension     There are no active problems to display for this patient.   Past Surgical History:  Procedure Laterality Date  . APPENDECTOMY         Home Medications    Prior to Admission medications   Not on File    Family History Family History  Problem Relation Age of Onset  . Diabetes Mother     Social History Social History   Tobacco Use  . Smoking status: Never Smoker  . Smokeless tobacco: Never Used  . Tobacco comment: 05/10/17 rarely  Substance Use Topics  . Alcohol use: Yes    Comment: social  . Drug use: Never     Allergies   Contrast media [iodinated diagnostic agents]   Review of Systems Review of Systems   Physical Exam Triage Vital Signs ED Triage Vitals  Enc Vitals Group    BP 08/15/17 0815 (!) 129/92     Pulse Rate 08/15/17 0815 69     Resp 08/15/17 0815 16     Temp 08/15/17 0815 97.9 F (36.6 C)     Temp Source 08/15/17 0815 Oral     SpO2 08/15/17 0815 100 %     Weight --      Height --      Head Circumference --      Peak Flow --      Pain Score 08/15/17 0821 8     Pain Loc --      Pain Edu? --      Excl. in GC? --    No data found.  Updated Vital Signs BP (!) 129/92 (BP Location: Left Arm)   Pulse 69   Temp 97.9 F (36.6 C) (Oral)   Resp 16   SpO2 100%   Visual Acuity Right Eye Distance:   Left Eye Distance:   Bilateral Distance:    Right Eye Near:   Left Eye Near:    Bilateral Near:     Physical Exam  Constitutional: He is oriented to person, place, and time. He appears well-developed and well-nourished.  HENT:  Head: Normocephalic and atraumatic.  Eyes: Pupils are equal, round, and reactive to light. EOM are normal.  Neck:  Normal range of motion. No neck rigidity. No Brudzinski's sign and no Kernig's sign noted.  Cardiovascular: Normal rate, regular rhythm and normal heart sounds.  Pulmonary/Chest: Effort normal and breath sounds normal.  Musculoskeletal:  Mild tenderness to palpation of cervical paravertebral muscles.   Lymphadenopathy:    He has no cervical adenopathy.  Neurological: He is alert and oriented to person, place, and time.  Weakness in left arm compared to right.  No facial droop, slurred speech.  No arm drift.  Decreased sensation in left arm.   Skin: Skin is warm and dry. Capillary refill takes less than 2 seconds.  Psychiatric: He has a normal mood and affect.  Nursing note and vitals reviewed.    UC Treatments / Results  Labs (all labs ordered are listed, but only abnormal results are displayed) Labs Reviewed - No data to display  EKG None  Radiology No results found.  Procedures Procedures (including critical care time)  Medications Ordered in UC Medications - No data to display  Initial  Impression / Assessment and Plan / UC Course  I have reviewed the triage vital signs and the nursing notes.  Pertinent labs & imaging results that were available during my care of the patient were reviewed by me and considered in my medical decision making (see chart for details).     Possible headache post lumbar puncture. Based on patient's history, chief complaint recommend patient call his neurologist ASAP and see what they recommend for further treatment.  If the headache gets worse or he develops any worsening symptoms recommend he go to the ER.  Focal weakness in left arm is chronic and currently being managed by neurology.  No other new focal deficits on exam.  Return precautions given. Final Clinical Impressions(s) / UC Diagnoses   Final diagnoses:  Acute intractable headache, unspecified headache type     Discharge Instructions     It was nice meeting you!!  Please call your neurologist ASAP and see what they recommend. If they are unable to see you or your symptoms worsen, please go to the ER.      ED Prescriptions    None     Controlled Substance Prescriptions [place] Controlled Substance Registry consulted? Not Applicable   Janace ArisBast, Charlestine Rookstool A, NP 08/15/17 1027

## 2017-08-15 NOTE — Discharge Instructions (Signed)
It was nice meeting you!!  Please call your neurologist ASAP and see what they recommend. If they are unable to see you or your symptoms worsen, please go to the ER.

## 2017-08-15 NOTE — Telephone Encounter (Signed)
Using [employer], [place] [phone] attempted to call pt and could not LVM.  Will attempt tomorrow.

## 2017-08-15 NOTE — ED Triage Notes (Signed)
Pt reports frontal head pain x3 days with fullness and hearing loss in his left ear.

## 2017-08-16 LAB — CSF CELL COUNT WITH DIFFERENTIAL
RBC COUNT CSF: 610 {cells}/uL — AB (ref 0–10)
WBC CSF: 1 {cells}/uL (ref 0–5)

## 2017-08-16 LAB — CSF CULTURE
MICRO NUMBER: 90822298
SPECIMEN QUALITY: ADEQUATE

## 2017-08-16 LAB — GLUCOSE, CSF: GLUCOSE CSF: 70 mg/dL (ref 40–80)

## 2017-08-16 LAB — CSF CULTURE W GRAM STAIN: Result:: NO GROWTH

## 2017-08-16 LAB — OLIGOCLONAL BANDS, CSF + SERM

## 2017-08-16 LAB — PROTEIN, CSF: Total Protein, CSF: 33 mg/dL (ref 15–45)

## 2017-08-16 NOTE — Telephone Encounter (Signed)
Attempted to call pt with [employer] Mardelle Mattendy  [phone]. No answer and no VM.  Will try tomorrow at an earlier time.

## 2017-08-17 ENCOUNTER — Other Ambulatory Visit: Payer: Self-pay | Admitting: *Deleted

## 2017-08-17 DIAGNOSIS — M6281 Muscle weakness (generalized): Secondary | ICD-10-CM

## 2017-08-17 DIAGNOSIS — G6289 Other specified polyneuropathies: Secondary | ICD-10-CM

## 2017-08-17 LAB — MULTIPLE MYELOMA PANEL, SERUM
ALBUMIN SERPL ELPH-MCNC: 4.4 g/dL (ref 2.9–4.4)
ALPHA 1: 0.2 g/dL (ref 0.0–0.4)
ALPHA2 GLOB SERPL ELPH-MCNC: 0.6 g/dL (ref 0.4–1.0)
Albumin/Glob SerPl: 1.5 (ref 0.7–1.7)
B-GLOBULIN SERPL ELPH-MCNC: 1.2 g/dL (ref 0.7–1.3)
GLOBULIN, TOTAL: 3.1 g/dL (ref 2.2–3.9)
Gamma Glob SerPl Elph-Mcnc: 1.1 g/dL (ref 0.4–1.8)
IGG (IMMUNOGLOBIN G), SERUM: 1195 mg/dL (ref 700–1600)
IgA/Immunoglobulin A, Serum: 198 mg/dL (ref 90–386)
IgM (Immunoglobulin M), Srm: 120 mg/dL (ref 20–172)
Total Protein: 7.5 g/dL (ref 6.0–8.5)

## 2017-08-17 LAB — GQ1B ANTIBODY IGG

## 2017-08-17 LAB — ANA,IFA RA DIAG PNL W/RFLX TIT/PATN
ANA Titer 1: NEGATIVE
CYCLIC CITRULLIN PEPTIDE AB: 4 U (ref 0–19)
Rhuematoid fact SerPl-aCnc: <10 IU/mL (ref 0.0–13.9)

## 2017-08-17 LAB — ALDOLASE: Aldolase: 9.8 U/L (ref 3.3–10.3)

## 2017-08-17 LAB — VITAMIN B12: Vitamin B-12: 1752 pg/mL — ABNORMAL HIGH (ref 232–1245)

## 2017-08-17 LAB — HEMOGLOBIN A1C
ESTIMATED AVERAGE GLUCOSE: 123 mg/dL
Hgb A1c MFr Bld: 5.9 % — ABNORMAL HIGH (ref 4.8–5.6)

## 2017-08-17 LAB — TSH: TSH: 1.27 u[IU]/mL (ref 0.450–4.500)

## 2017-08-17 LAB — GM1 ANTIBODY IGG, IGM
GM 1 IgG: <1:100 {titer}
GM 1 IgM: <1:100 {titer}

## 2017-08-17 LAB — CK: Total CK: 500 U/L — ABNORMAL HIGH (ref 24–204)

## 2017-08-17 NOTE — Telephone Encounter (Signed)
I spoke to pt thru [address] [employer] ([place]) [place], [phone].  Relayed to pt that CSF results were unremarkable, and lab results showed a slightly elevated CK (which could represent muscle disease), [facility]  Pt is ok to do this.   He is doing better these last 2 days.  Was seen in ED after had LP 08-15-17 (for nausea/vomiting, headache).  Otherwise no change in his sx from ofv visit. [facility] and pt is best time to call around 0930 and 1500 at [phone].  He has no VM.  Verified address.

## 2017-08-17 NOTE — Telephone Encounter (Signed)
-----   Message from Suanne MarkerVikram R Penumalli, MD sent at 08/14/2017  3:11 PM EDT ----- Slightly elevated CK (could represent muscle disease). [facility]. Please call patient. -VRP

## 2017-08-21 ENCOUNTER — Telehealth: Payer: Self-pay | Admitting: Diagnostic Neuroimaging

## 2017-08-21 NOTE — Telephone Encounter (Signed)
[facility] . To Att. Frederick Peerseresa Johnson Telephone [phone] - fax [phone] . Cody Fleming is aware and she will set up an interpreter . Interpreter will call patient to schedule.

## 2017-11-14 ENCOUNTER — Ambulatory Visit: Payer: Self-pay | Admitting: Diagnostic Neuroimaging

## 2017-11-14 ENCOUNTER — Telehealth: Payer: Self-pay | Admitting: *Deleted

## 2017-11-14 ENCOUNTER — Encounter: Payer: Self-pay | Admitting: Diagnostic Neuroimaging

## 2017-11-14 NOTE — Telephone Encounter (Signed)
Patient no-showed today's FU appointment. 

## 2018-05-30 IMAGING — MR MR CERVICAL SPINE WO/W CM
5 of 9 series · 24 of 48 positions shown · non-contrast
Comparison: none

[Series 3: T1 · sagittal · 3.0mm · 0.41mm/px · 4 of 13 slices shown (1 of 3)]
[im 1/13]
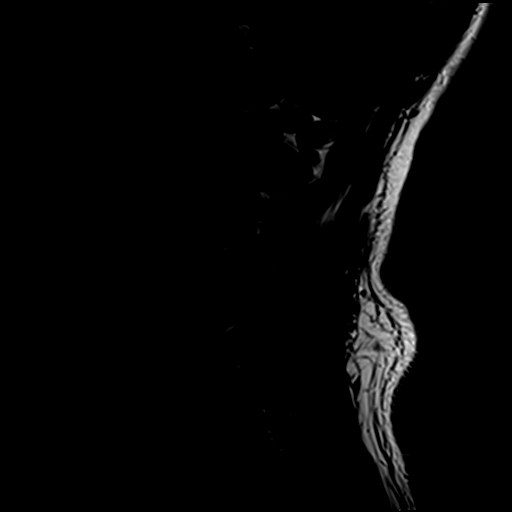
[im 5/13]
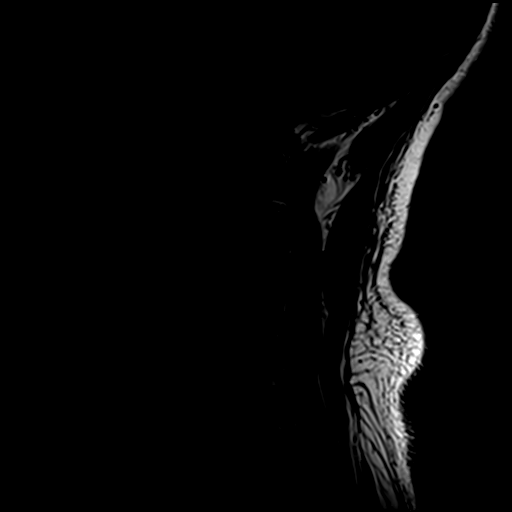
[im 9/13]
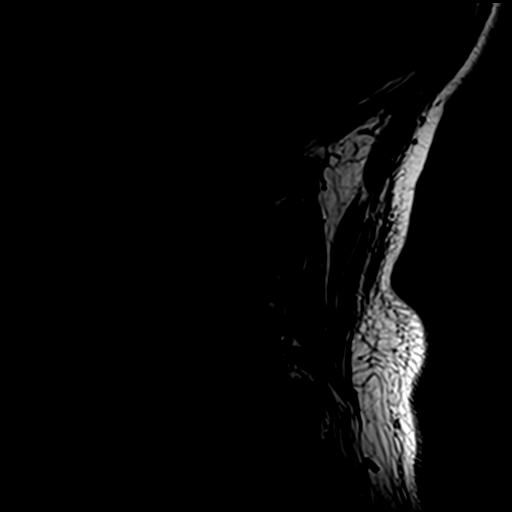
[im 13/13]
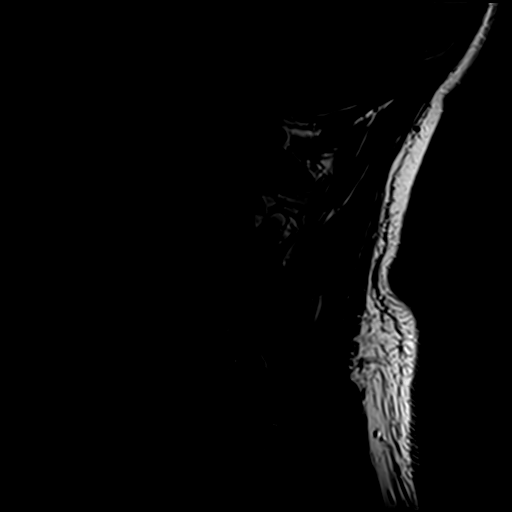

[Series 5: T2 · axial · 3.0mm · 0.70mm/px · z∈[-157,-63]mm · 6 of 23 slices shown (1 of 2)]
[im 1/23]
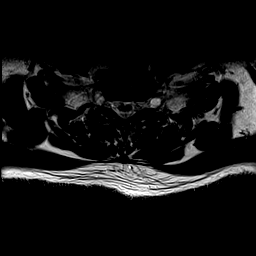
[im 5/23]
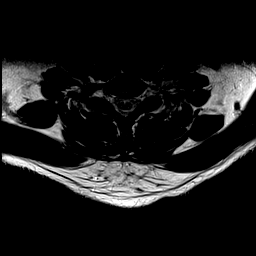
[im 9/23]
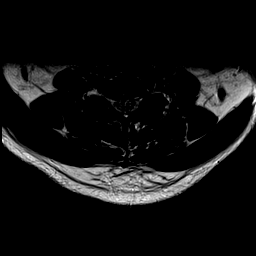
[im 14/23]
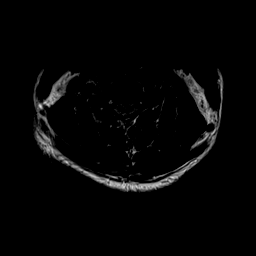
[im 18/23]
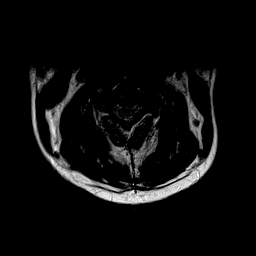
[im 23/23]
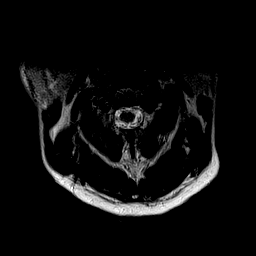

[Series 6: T1 · axial · 3.0mm · 0.35mm/px · z∈[-157,-63]mm · 7 of 26 slices shown (2 of 3)]
[im 1/26]
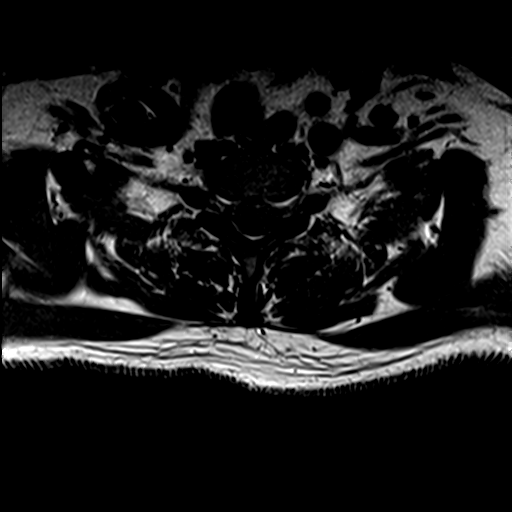
[im 5/26]
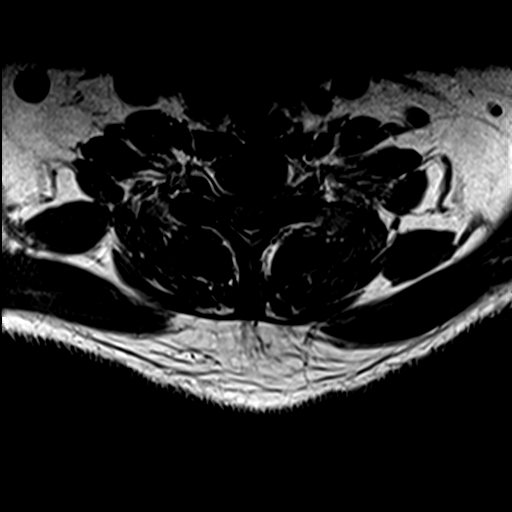
[im 9/26]
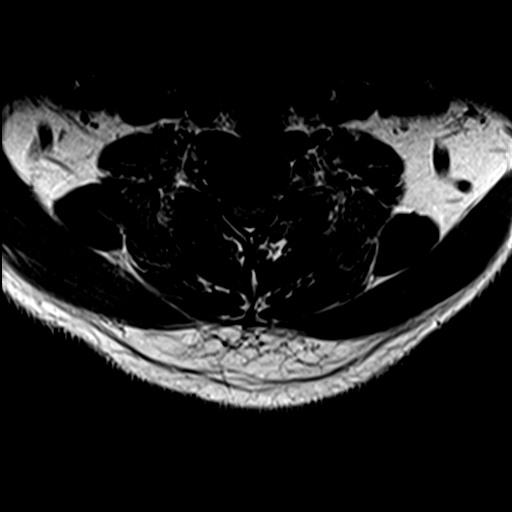
[im 13/26]
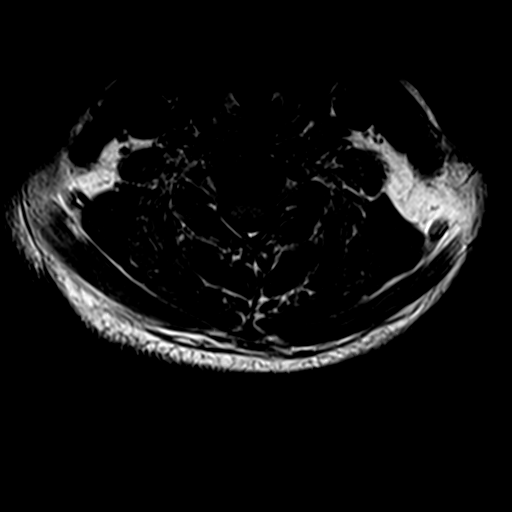
[im 17/26]
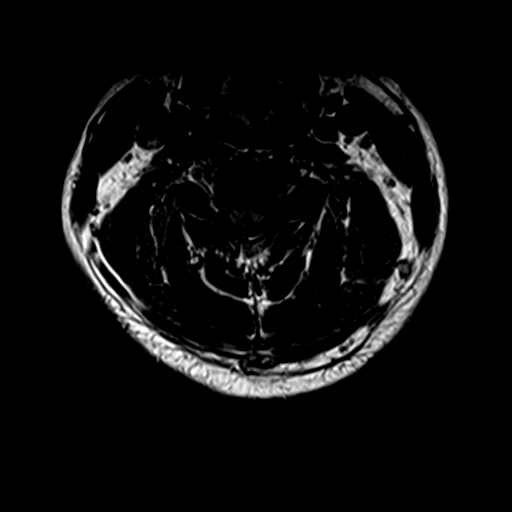
[im 21/26]
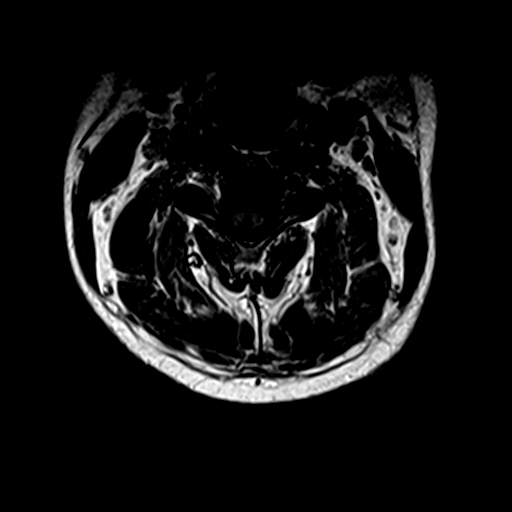
[im 26/26]
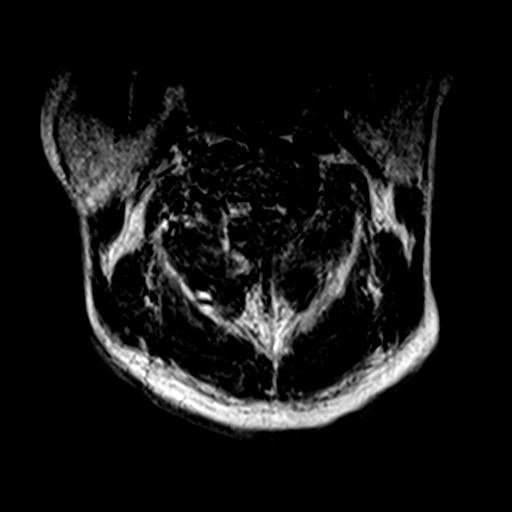

[Series 7: T1 · axial · 3.0mm · 0.35mm/px · z∈[-157,-112]mm · 4 of 26 slices shown (3 of 3)]
[im 1/26]
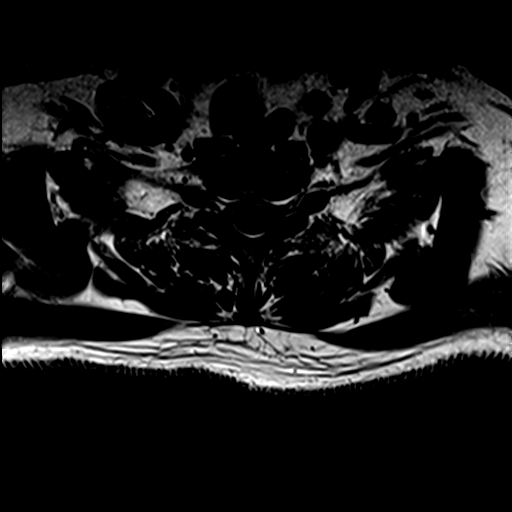
[im 5/26]
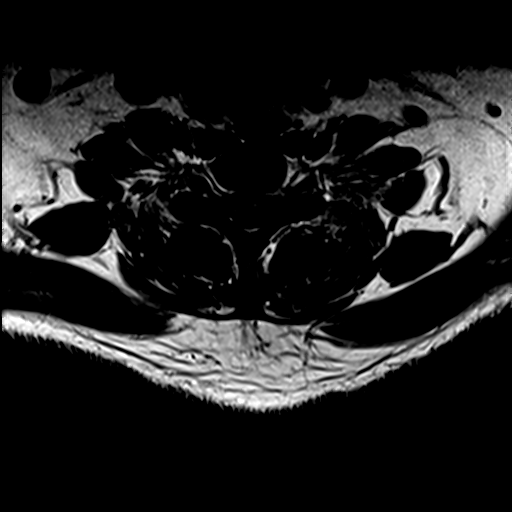
[im 9/26]
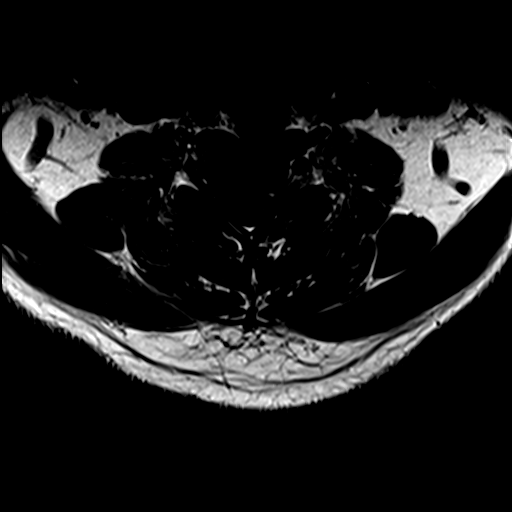
[im 13/26]
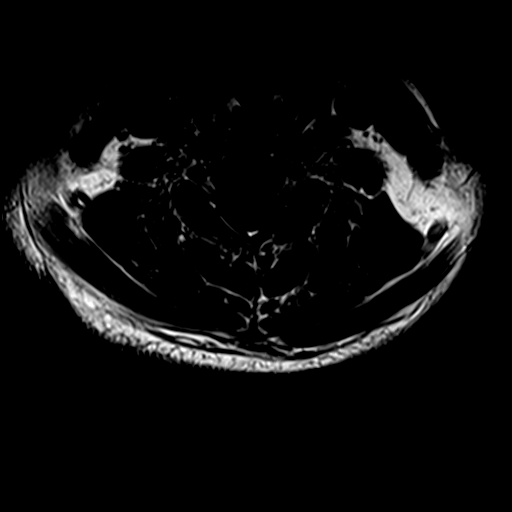

[Series 8: T2 · sagittal · 3.0mm · 0.41mm/px · 3 of 13 slices shown (2 of 2)]
[im 1/13]
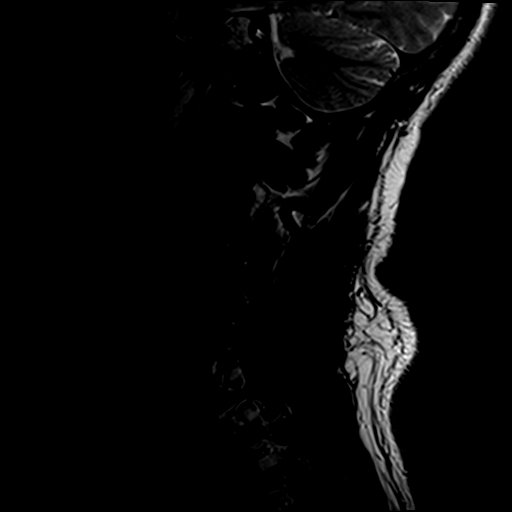
[im 7/13]
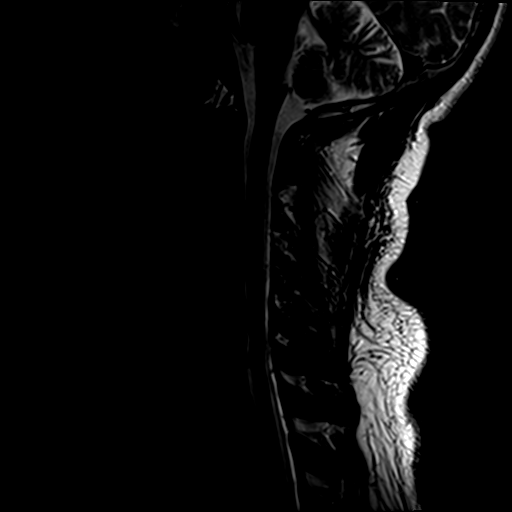
[im 13/13]
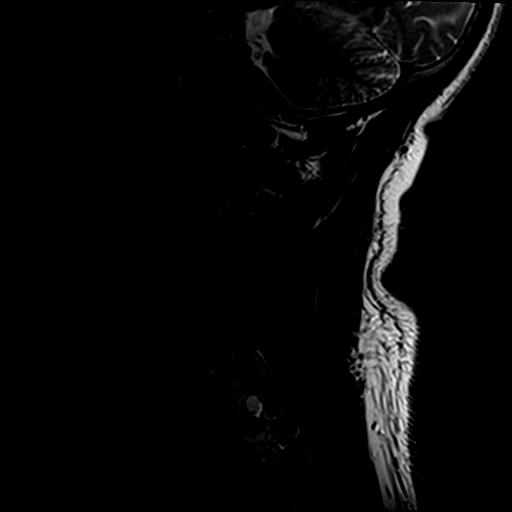

[24 of 48 positions shown; findings below may reference images not displayed]

Canned report from images found in remote index.

Refer to host system for actual result text.

## 2018-12-10 ENCOUNTER — Ambulatory Visit: Payer: Self-pay | Attending: Nurse Practitioner | Admitting: Nurse Practitioner

## 2018-12-10 ENCOUNTER — Other Ambulatory Visit: Payer: Self-pay

## 2018-12-10 ENCOUNTER — Encounter: Payer: Self-pay | Admitting: Nurse Practitioner

## 2018-12-10 DIAGNOSIS — G1221 Amyotrophic lateral sclerosis: Secondary | ICD-10-CM

## 2018-12-10 DIAGNOSIS — Z13220 Encounter for screening for lipoid disorders: Secondary | ICD-10-CM

## 2018-12-10 DIAGNOSIS — Z7689 Persons encountering health services in other specified circumstances: Secondary | ICD-10-CM

## 2018-12-10 DIAGNOSIS — Z8679 Personal history of other diseases of the circulatory system: Secondary | ICD-10-CM

## 2018-12-10 DIAGNOSIS — Z13228 Encounter for screening for other metabolic disorders: Secondary | ICD-10-CM

## 2018-12-10 DIAGNOSIS — Z130 Encounter for screening for diseases of the blood and blood-forming organs and certain disorders involving the immune mechanism: Secondary | ICD-10-CM

## 2018-12-10 DIAGNOSIS — M25512 Pain in left shoulder: Secondary | ICD-10-CM

## 2018-12-10 DIAGNOSIS — R7303 Prediabetes: Secondary | ICD-10-CM

## 2018-12-10 DIAGNOSIS — G8929 Other chronic pain: Secondary | ICD-10-CM

## 2018-12-10 DIAGNOSIS — M25511 Pain in right shoulder: Secondary | ICD-10-CM

## 2018-12-10 MED ORDER — CYCLOBENZAPRINE HCL 5 MG PO TABS: 5.0000 mg | ORAL_TABLET | Freq: Two times a day (BID) | ORAL | 1 refills | Status: AC | PRN

## 2018-12-10 MED ORDER — DICLOFENAC SODIUM 50 MG PO TBEC: 50.0000 mg | DELAYED_RELEASE_TABLET | Freq: Two times a day (BID) | ORAL | 1 refills | Status: AC

## 2018-12-10 MED FILL — CYCLOBENZAPRINE 5 MG TABLET: 5 | 30 days supply | Qty: 60 | Fill #0

## 2018-12-10 MED FILL — DICLOFENAC SOD EC 50 MG TAB: 50 | 30 days supply | Qty: 60 | Fill #0

## 2018-12-10 NOTE — Progress Notes (Signed)
Virtual Visit via Telephone Note Due to national recommendations of social distancing due to [name], telehealth visit is felt to be most appropriate for this patient at this time.  I discussed the limitations, risks, security and privacy concerns of performing an evaluation and management service by telephone and the availability of in person appointments. I also discussed with the patient that there may be a patient responsible charge related to this service. The patient expressed understanding and agreed to proceed.    I connected with Cody Fleming on 12/10/18  at   8:50 AM EST  EDT by telephone and verified that I am speaking with the correct person using two identifiers.   Consent I discussed the limitations, risks, security and privacy concerns of performing an evaluation and management service by telephone and the availability of in person appointments. I also discussed with the patient that there may be a patient responsible charge related to this service. The patient expressed understanding and agreed to proceed.   Location of Patient: Private Residence   Location of Provider: [place] and [place] participating in Telemedicine visit: [name] FNP-BC [facility]  [place] Interpreter ID# [name]   History of Present Illness: Telemedicine visit for: Establish Care  has a past medical history of ALS (amyotrophic lateral sclerosis) ([facility]) and Hypertension.  He has not followed up with Neurology or the [facility] clinic since last year. Wanting to establish care here however I instructed him as he is uninsured and has yet to complete the financial assistance packet he will need to continue to follow up at the [facility] in [place].  It is unclear why he stopped seeing them last year.  PER NEUROLOGY We have recommended Riluzole, however the patient cannot afford and given his immigration status he does not qualify for  goverment assistance. He will contact his family in [place] to discuss possibly obtaining this medication in his home country.   Patient lives with his girlfriend his sister and brother in a two level home. There are 4 steps to enter home (railing on one side). His bedroom and bathroom are on main level. He does not work but does drive. His brother works all days but Monday, sister is always there. Sister and girlfriend do the cooking.  Endorses chronic bilateral shoulder and hip pain and weakness. Can barely lift his arms in regard to putting on clothes, ADLs. etc. Will need to be referred back to [facility] as well as PT/OT.   Per last note from [name] dated 04-11-2018 Pt was scheduled for ALS clinic today, however, he did not show up nor did he call to cancel. Attempted to contact pt at # listed, no answer, unable to leave message, only getting a busy message. Will send pt a letter to contact the clinic at [phone] to schedule a f/u appt.   I have instructed him today he needs to call their office to schedule as soon as possible.   Essential Hypertension He is not currently taking any antihypertensives. Does not monitor blood pressure at home. Denies chest pain, shortness of breath, palpitations, lightheadedness, dizziness, headaches or BLE edema.  I am unable to locate any HTN medications on his profile.  BP Readings from Last 3 Encounters:  08/15/17 (!) 129/92  08/10/17 133/75  05/10/17 137/90    Past Medical History:  Diagnosis Date  . ALS (amyotrophic lateral sclerosis) ([facility])   . Hypertension  Past Surgical History:  Procedure Laterality Date  . APPENDECTOMY      Family History  Problem Relation Age of Onset  . Diabetes Mother     Social History   Socioeconomic History  . Marital status: Single    Spouse name: Not on file  . Number of children: 0  . Years of education: 5  . 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 Financial resource strain: Not on file  . Food insecurity    Worry: Not on file    Inability: Not on file  . Transportation needs    Medical: Not on file    Non-medical: Not on file  Tobacco Use  . Smoking status: Never Smoker  . Smokeless tobacco: Never Used  . Tobacco comment: 05/10/17 rarely  Substance and Sexual Activity  . Alcohol use: Yes    Comment: social  . Drug use: Never  . Sexual activity: Not on file  Lifestyle  . Physical activity    Days per week: Not on file    Minutes per session: Not on file  . Stress: Not on file  Relationships  . Social Herbalist on phone: Not on file    Gets together: Not on file    Attends religious service: Not on file    Active member of club or organization: Not on file    Attends meetings of clubs or organizations: Not on file    Relationship status: Not on file  Other Topics Concern  . 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  HENT: Negative.  Negative for nosebleeds.   Eyes: Negative.  Negative for blurred vision, double vision and photophobia.  Respiratory: Negative.  Negative for cough and shortness of breath.   Cardiovascular: Negative.  Negative for chest pain, palpitations and leg swelling.  Gastrointestinal: Negative.  Negative for heartburn, nausea and vomiting.  Musculoskeletal: Positive for myalgias.  Neurological: Positive for weakness. Negative for dizziness, focal weakness, seizures and headaches.  Psychiatric/Behavioral: Negative.  Negative for suicidal ideas.    Assessment and Plan: Diagnoses and all orders for this visit:  Encounter to establish care  ALS (amyotrophic lateral sclerosis) ([name]) -     Ambulatory  referral to Neurology  Chronic pain of both shoulders -     diclofenac (VOLTAREN) 50 MG EC tablet; Take 1 tablet (50 mg total) by mouth 2 (two) times daily. -     cyclobenzaprine (FLEXERIL) 5 MG tablet; Take 1 tablet (5 mg total) by mouth 2 (two) times daily as needed for muscle spasms.  Prediabetes -     A1c; Future Lab Results  Component Value Date   HGBA1C 5.9 (H) 07/27/2017    Lipid screening -     Lipid Panel; Future  Screening for metabolic disorder -     RUE45+WUJW; Future  Screening for deficiency anemia -     CBC; Future  History of hypertension DASH/Mediterranean Diets are healthier choices for HTN.     Follow Up Instructions Return in about 6 weeks (around 01/21/2019).     I discussed the assessment and treatment plan with the patient. The patient was provided an  opportunity to ask questions and all were answered. The patient agreed with the plan and demonstrated an understanding of the instructions.   The patient was advised to call back or seek an in-person evaluation if the symptoms worsen or if the condition fails to improve as anticipated.  I provided 26 minutes of non-face-to-face time during this encounter including median intraservice time, reviewing previous notes, labs, imaging, medications and explaining diagnosis and management.  Gildardo Pounds, FNP-BC

## 2018-12-10 NOTE — Progress Notes (Signed)
New Patient  Deep pain in both shoulder and lost strength in arms and movement in fingers. Per pt this started like in Jan or Feb.  Per pt he have a problem on the side of his buttocks

## 2018-12-11 ENCOUNTER — Ambulatory Visit: Payer: Self-pay | Attending: Nurse Practitioner

## 2018-12-11 DIAGNOSIS — Z130 Encounter for screening for diseases of the blood and blood-forming organs and certain disorders involving the immune mechanism: Secondary | ICD-10-CM

## 2018-12-11 DIAGNOSIS — Z13220 Encounter for screening for lipoid disorders: Secondary | ICD-10-CM

## 2018-12-11 DIAGNOSIS — R7303 Prediabetes: Secondary | ICD-10-CM

## 2018-12-11 DIAGNOSIS — Z13228 Encounter for screening for other metabolic disorders: Secondary | ICD-10-CM

## 2018-12-12 ENCOUNTER — Other Ambulatory Visit: Payer: Self-pay | Admitting: Nurse Practitioner

## 2018-12-12 LAB — HEMOGLOBIN A1C
Est. average glucose Bld gHb Est-mCnc: 128 mg/dL
Hgb A1c MFr Bld: 6.1 % — ABNORMAL HIGH (ref 4.8–5.6)

## 2018-12-12 LAB — CMP14+EGFR
ALT: 177 IU/L — ABNORMAL HIGH (ref 0–44)
AST: 85 IU/L — ABNORMAL HIGH (ref 0–40)
Albumin/Globulin Ratio: 1.6 (ref 1.2–2.2)
Albumin: 4.9 g/dL (ref 4.0–5.0)
Alkaline Phosphatase: 119 IU/L — ABNORMAL HIGH (ref 39–117)
BUN/Creatinine Ratio: 14 (ref 9–20)
BUN: 12 mg/dL (ref 6–24)
Bilirubin Total: 0.4 mg/dL (ref 0.0–1.2)
CO2: 22 mmol/L (ref 20–29)
Calcium: 10 mg/dL (ref 8.7–10.2)
Chloride: 104 mmol/L (ref 96–106)
Creatinine, Ser: 0.86 mg/dL (ref 0.76–1.27)
GFR calc Af Amer: 125 mL/min/{1.73_m2} (ref >59)
GFR calc non Af Amer: 108 mL/min/{1.73_m2} (ref >59)
Globulin, Total: 3 g/dL (ref 1.5–4.5)
Glucose: 94 mg/dL (ref 65–99)
Potassium: 4.7 mmol/L (ref 3.5–5.2)
Sodium: 142 mmol/L (ref 134–144)
Total Protein: 7.9 g/dL (ref 6.0–8.5)

## 2018-12-12 LAB — LIPID PANEL
Chol/HDL Ratio: 6.3 ratio — ABNORMAL HIGH (ref 0.0–5.0)
Cholesterol, Total: 215 mg/dL — ABNORMAL HIGH (ref 100–199)
HDL: 34 mg/dL — ABNORMAL LOW (ref >39)
LDL Chol Calc (NIH): 136 mg/dL — ABNORMAL HIGH (ref 0–99)
Triglycerides: 247 mg/dL — ABNORMAL HIGH (ref 0–149)
VLDL Cholesterol Cal: 45 mg/dL — ABNORMAL HIGH (ref 5–40)

## 2018-12-12 LAB — CBC
Hematocrit: 43.5 % (ref 37.5–51.0)
Hemoglobin: 14.6 g/dL (ref 13.0–17.7)
MCH: 28.5 pg (ref 26.6–33.0)
MCHC: 33.6 g/dL (ref 31.5–35.7)
MCV: 85 fL (ref 79–97)
Platelets: 328 10*3/uL (ref 150–450)
RBC: 5.12 x10E6/uL (ref 4.14–5.80)
RDW: 12.9 % (ref 11.6–15.4)
WBC: 13.2 10*3/uL — ABNORMAL HIGH (ref 3.4–10.8)

## 2018-12-12 MED ORDER — PRAVASTATIN SODIUM 20 MG PO TABS: 20.0000 mg | ORAL_TABLET | Freq: Every day | ORAL | 3 refills | Status: DC

## 2018-12-13 ENCOUNTER — Telehealth: Payer: Self-pay | Admitting: Nurse Practitioner

## 2018-12-13 NOTE — Telephone Encounter (Signed)
Patient returned nurse call regarding lab results. Please f/u  °

## 2018-12-17 NOTE — Telephone Encounter (Signed)
Patient called requesting his lab results. Please f/u  

## 2018-12-18 NOTE — Telephone Encounter (Signed)
Attempt to call patient to inform on results. No answer and LVM for a call back.

## 2018-12-18 NOTE — Telephone Encounter (Signed)
Patient called back. Pt. Was inform on lab results and scheduled for an appt in December with PCP.

## 2018-12-24 ENCOUNTER — Ambulatory Visit: Payer: Self-pay

## 2019-01-01 ENCOUNTER — Ambulatory Visit: Payer: Self-pay | Attending: Nurse Practitioner

## 2019-01-01 ENCOUNTER — Other Ambulatory Visit: Payer: Self-pay

## 2019-01-21 ENCOUNTER — Encounter: Payer: Self-pay | Admitting: Nurse Practitioner

## 2019-01-21 ENCOUNTER — Ambulatory Visit: Payer: Self-pay | Attending: Nurse Practitioner | Admitting: Nurse Practitioner

## 2019-01-21 ENCOUNTER — Other Ambulatory Visit: Payer: Self-pay

## 2019-01-21 DIAGNOSIS — G1221 Amyotrophic lateral sclerosis: Secondary | ICD-10-CM

## 2019-01-21 NOTE — Progress Notes (Signed)
Virtual Visit via Telephone Note Due to national recommendations of social distancing due to [name] 19, telehealth visit is felt to be most appropriate for this patient at this time.  I discussed the limitations, risks, security and privacy concerns of performing an evaluation and management service by telephone and the availability of in person appointments. I also discussed with the patient that there may be a patient responsible charge related to this service. The patient expressed understanding and agreed to proceed.    I connected with Proctor Montoro on 01/21/19  at   1:50 PM EST  EDT by telephone and verified that I am speaking with the correct person using two identifiers.   Consent I discussed the limitations, risks, security and privacy concerns of performing an evaluation and management service by telephone and the availability of in person appointments. I also discussed with the patient that there may be a patient responsible charge related to this service. The patient expressed understanding and agreed to proceed.   Location of Patient: Private Residence    Location of Provider: [place] and [place] participating in Telemedicine visit: [name] FNP-BC [place] Interpreter ID#    History of Present Illness: Telemedicine visit for: ALS symptoms  He has not heard back from the [facility] clinic. Referral was placed at his last office visit. He notes progressive numbness and tingling in bilateral hands and BLE. He will need to return to the [facility] clinic. Endorses chronic bilateral shoulder and hip pain and weakness. Can barely lift his arms in regard to putting on clothes, ADLs. etc.    He had previously been followed by [facility] for evaluation of his symptoms since at least April of 2019. His work up as consistent of a negative brain and C-spine MRI and normal serologic evaluation. In June of 2019 he  underwent a NCS/EMG which showed proximal and distal acute and chronic denervation in the proximal and distal upper extremity, the distal lower extremity and thoraic paraspinals. At this time he was sent to [facility] general Neurology for further evaluation and was seen by Dr. Vicente Serene, who feelt his presentation was consistent with ALS and referred him to [facility].   He has not followed up with Neurology or the [facility] clinic since last year. Wanting to establish care here however we do not treat ALS here in this clinic and he will need to continue to follow up at the [facility] in [place].  It is unclear why he stopped seeing them last year.   PER NEUROLOGY We have recommended Riluzole, however the patient cannot afford and given his immigration status he does not qualify for goverment assistance. He will contact his family in [place] to discuss possibly obtaining this medication in his home country.   Patient lives with his girlfriend his sister and brother in a two level home. There are 4 steps to enter home (railing on one side). His bedroom and bathroom are on main level. He does not work but does drive. His brother works all days but Monday, sister is always there. Sister and girlfriend do the cooking.   Per last note from [facility] clinic dated 04-11-2018 Pt was scheduled for ALS clinic today, however, he did not show up nor did he call to cancel. Attempted to contact pt at # listed, no answer, unable to leave message, only getting a busy message. Will send pt a letter to contact the clinic at  [phone] to schedule a f/u appt.    I will have the referral coordinator attempt to schedule him with the ALS clinic today.    Past Medical History:  Diagnosis Date  . ALS (amyotrophic lateral sclerosis) ([facility])   . Hypertension     Past Surgical History:  Procedure Laterality Date  . APPENDECTOMY      Family History  Problem Relation Age of Onset  . Diabetes Mother     Social History    Socioeconomic History  . Marital status: Single    Spouse name: Not on file  . Number of children: 0  . Years of education: 5  . 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 Smoking status: Never Smoker  . Smokeless tobacco: Never Used  . Tobacco comment: 05/10/17 rarely  Substance and Sexual Activity  . Alcohol use: Yes    Comment: social  . Drug use: Never  . Sexual activity: Not on file  Other Topics Concern  . 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 Difficulty of Paying Living Expenses: Not on file  Food Insecurity:   . Worried About Programme researcher, broadcasting/film/videounning Out of Food in the Last Year: Not on file  . Ran Out of Food in the Last Year: Not on file  Transportation Needs:   . Lack of Transportation (Medical): Not on file  . Lack of Transportation (Non-Medical): Not on file  Physical Activity:   . Days of Exercise per Week: Not on file  . Minutes of Exercise per Session: Not on file  Stress:   . Feeling of Stress : Not on file  Social Connections:   . Frequency of Communication with Friends and Family: Not on file  . Frequency of Social Gatherings with Friends and Family: Not on file  . Attends Religious Services: Not on file  . Active Member of Clubs or Organizations: Not on file  . Attends BankerClub or Organization Meetings: Not on file  . Marital Status: Not on file     Observations/Objective: Awake, alert and oriented x 3   Review of Systems  Constitutional: Negative for fever, malaise/fatigue and weight loss.  HENT: Negative.  Negative for nosebleeds.   Eyes: Negative.  Negative for blurred vision, double vision and photophobia.  Respiratory: Negative.  Negative for cough and shortness of breath.   Cardiovascular: Negative.  Negative for chest pain,  palpitations and leg swelling.  Gastrointestinal: Negative.  Negative for heartburn, nausea and vomiting.  Musculoskeletal: Positive for myalgias.  Neurological: Positive for sensory change and weakness. Negative for dizziness, focal weakness, seizures and headaches.  Psychiatric/Behavioral: Negative.  Negative for suicidal ideas.    Assessment and Plan: Rue was seen today for follow-up.  Diagnoses and all orders for this visit:  ALS (amyotrophic lateral sclerosis) ([facility])   I will have the referral coordinator attempt to schedule him with the ALS clinic today.   Follow Up Instructions Return if symptoms worsen or fail to improve.     I discussed the assessment and treatment plan with the patient. The patient was provided an opportunity to ask questions and all were answered. The patient agreed with the plan and demonstrated an understanding of the instructions.   The patient was advised to call  back or seek an in-person evaluation if the symptoms worsen or if the condition fails to improve as anticipated.  I provided 19 minutes of non-face-to-face time during this encounter including median intraservice time, reviewing previous notes, labs, imaging, medications and explaining diagnosis and management.  [name], FNP-BC

## 2019-02-22 ENCOUNTER — Encounter: Payer: Self-pay | Admitting: Nurse Practitioner

## 2019-02-22 ENCOUNTER — Ambulatory Visit: Payer: Self-pay | Attending: Nurse Practitioner | Admitting: Nurse Practitioner

## 2019-02-22 ENCOUNTER — Other Ambulatory Visit: Payer: Self-pay

## 2019-02-22 DIAGNOSIS — E785 Hyperlipidemia, unspecified: Secondary | ICD-10-CM

## 2019-02-22 DIAGNOSIS — D72829 Elevated white blood cell count, unspecified: Secondary | ICD-10-CM

## 2019-02-22 DIAGNOSIS — J069 Acute upper respiratory infection, unspecified: Secondary | ICD-10-CM

## 2019-02-22 DIAGNOSIS — R7989 Other specified abnormal findings of blood chemistry: Secondary | ICD-10-CM

## 2019-02-22 MED ORDER — FLUTICASONE PROPIONATE 50 MCG/ACT NA SUSP: 2.0000 | Freq: Every day | NASAL | 1 refills | Status: DC

## 2019-02-22 MED ORDER — BENZONATATE 200 MG PO CAPS: 200.0000 mg | ORAL_CAPSULE | Freq: Two times a day (BID) | ORAL | 0 refills | Status: DC | PRN

## 2019-02-22 MED ORDER — GUAIFENESIN-DM 100-10 MG/5ML PO SYRP: 5.0000 mL | ORAL_SOLUTION | ORAL | 0 refills | Status: DC | PRN

## 2019-02-22 MED ORDER — PRAVASTATIN SODIUM 20 MG PO TABS: 20.0000 mg | ORAL_TABLET | Freq: Every day | ORAL | 3 refills | Status: DC

## 2019-02-22 MED FILL — FLUTICASONE PROP 50 MCG SPR: 50 | 30 days supply | Qty: 16 | Fill #0

## 2019-02-22 MED FILL — ?PRAVASTATIN NA 20MG TABL: 20 | 30 days supply | Qty: 30 | Fill #0

## 2019-02-22 MED FILL — BENZONATATE 100 MG CAPS: 100 | 15 days supply | Qty: 60 | Fill #0

## 2019-02-22 NOTE — Progress Notes (Signed)
Virtual Visit via Telephone Note Due to national recommendations of social distancing due to [place] 19, telehealth visit is felt to be most appropriate for this patient at this time.  I discussed the limitations, risks, security and privacy concerns of performing an evaluation and management service by telephone and the availability of in person appointments. I also discussed with the patient that there may be a patient responsible charge related to this service. The patient expressed understanding and agreed to proceed.    I connected with Cody Fleming on 02/22/19  at  10:50 AM EST  EDT by telephone and verified that I am speaking with the correct person using two identifiers.   Consent I discussed the limitations, risks, security and privacy concerns of performing an evaluation and management service by telephone and the availability of in person appointments. I also discussed with the patient that there may be a patient responsible charge related to this service. The patient expressed understanding and agreed to proceed.   Location of Patient: Private Residence   Location of Provider: [place] and [place] participating in Telemedicine visit: Cody Rankins FNP-BC [place] Interpreter ID# [phone]   History of Present Illness: Telemedicine visit for: Upper Respiratory Symptoms   Upper Respiratory Illness Patient complains of URI. Symptoms include facial and nasal congestion, cough, plugged sensation in both ears and chest pain. Onset of symptoms was 1 week ago, unchanged since that time.  He is not drinking much. Evaluation to date: none. Treatment to date: none. He also endorses other sick contacts in the home. No one in the family has been tested for COVID.     ALS He still has not heard from the [facility] clinic. We are trying to get in contact with the ALS clinic for scheduling.   Past Medical History:   Diagnosis Date  . ALS (amyotrophic lateral sclerosis) ([facility])   . Hypertension     Past Surgical History:  Procedure Laterality Date  . APPENDECTOMY      Family History  Problem Relation Age of Onset  . Diabetes Mother     Social History   Socioeconomic History  . Marital status: Single    Spouse name: Not on file  . Number of children: 0  . Years of education: 5  . 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 Smoking status: Never Smoker  . Smokeless tobacco: Never Used  . Tobacco comment: 05/10/17 rarely  Substance and Sexual Activity  . Alcohol use: Yes    Comment: social  . Drug use: Never  . Sexual activity: Not on file  Other Topics Concern  . 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 Difficulty of Paying Living Expenses: Not on file  Food Insecurity:   . Worried About Charity fundraiser in the Last Year: Not on file  . Ran Out of Food in the Last Year: Not on file  Transportation Needs:   . Lack of Transportation (Medical): Not on file  . Lack of Transportation (Non-Medical): Not on file  Physical Activity:   . Days of Exercise per Week: Not on file  . Minutes of Exercise per Session: Not on file  Stress:   . Feeling of Stress : Not on file  Social Connections:   . Frequency of Communication with Friends and Family: Not on file  . Frequency of Social Gatherings with Friends and Family: Not on file  . Attends Religious Services: Not on file  . Active Member of Clubs or Organizations: Not on file  . Attends Banker Meetings: Not on file  . Marital Status: Not on file     Observations/Objective: Awake, alert and oriented x 3   Review of Systems  Constitutional: Positive for malaise/fatigue. Negative for chills and  fever.  HENT: Positive for congestion. Negative for ear discharge, ear pain, hearing loss, sinus pain and sore throat.        Ear pressure bilateral  Eyes: Negative.   Respiratory: Positive for cough. Negative for sputum production, shortness of breath and wheezing.   Cardiovascular: Positive for chest pain. Negative for orthopnea and leg swelling.  Gastrointestinal: Negative.  Negative for abdominal pain, diarrhea, nausea and vomiting.  Neurological: Positive for weakness and headaches. Negative for dizziness and focal weakness.  Endo/Heme/Allergies: Negative for environmental allergies.  Psychiatric/Behavioral: Negative.     Assessment and Plan: Cody Fleming was seen today for cough.  Diagnoses and all orders for this visit:  Viral upper respiratory tract infection -     benzonatate (TESSALON) 200 MG capsule; Take 1 capsule (200 mg total) by mouth 2 (two) times daily as needed for cough. -     fluticasone (FLONASE) 50 MCG/ACT nasal spray; Place 2 sprays into both nostrils daily. -     guaiFENesin-dextromethorphan (ROBITUSSIN DM) 100-10 MG/5ML syrup; Take 5 mLs by mouth every 4 (four) hours as needed for cough.  Dyslipidemia, goal LDL below 100 -     pravastatin (PRAVACHOL) 20 MG tablet; Take 1 tablet (20 mg total) by mouth daily. -     Lipid panel; Future  Leukocytosis, unspecified type -     CBC; Future  Elevated LFTs -     Hepatic Function Panel; Future -     Hepatitis C Antibody; Future     Follow Up Instructions Return in about 3 months (around 05/23/2019).     I discussed the assessment and treatment plan with the patient. The patient was provided an opportunity to ask questions and all were answered. The patient agreed with the plan and demonstrated an understanding of the instructions.   The patient was advised to call back or seek an in-person evaluation if the symptoms worsen or if the condition fails to improve as anticipated.  I provided 14 minutes of non-face-to-face  time during this encounter including median intraservice time, reviewing previous notes, labs, imaging, medications and explaining diagnosis and management.  [name], FNP-BC

## 2019-03-01 ENCOUNTER — Encounter: Payer: Self-pay | Admitting: Nurse Practitioner

## 2019-03-01 ENCOUNTER — Ambulatory Visit: Payer: Self-pay | Attending: Nurse Practitioner | Admitting: Nurse Practitioner

## 2019-03-01 DIAGNOSIS — M25362 Other instability, left knee: Secondary | ICD-10-CM

## 2019-03-01 NOTE — Progress Notes (Signed)
Virtual Visit via Telephone Note Due to national recommendations of social distancing due to [place] 19, telehealth visit is felt to be most appropriate for this patient at this time.  I discussed the limitations, risks, security and privacy concerns of performing an evaluation and management service by telephone and the availability of in person appointments. I also discussed with the patient that there may be a patient responsible charge related to this service. The patient expressed understanding and agreed to proceed.    I connected with Cody Fleming on 03/01/19  at   8:50 AM EST  EDT by telephone and verified that I am speaking with the correct person using two identifiers.   Consent I discussed the limitations, risks, security and privacy concerns of performing an evaluation and management service by telephone and the availability of in person appointments. I also discussed with the patient that there may be a patient responsible charge related to this service. The patient expressed understanding and agreed to proceed.   Location of Patient: Private Residence   Location of Provider: [place] [facility] [place] participating in Telemedicine visit: [name] FNP-BC [place] [id number]   History of Present Illness: Telemedicine visit for: Knee Instability  Left knee. States knee is cracking and feels loose when he walks. Denies any pain. Aggravating factors: walking. Onset of symptoms: 3-4 weeks ago. States he fell sometime last year on his left knee. He did not seek treatment at that time. There is no swelling in the knee.     Past Medical History:  Diagnosis Date  . ALS (amyotrophic lateral sclerosis) ([facility])   . Hypertension     Past Surgical History:  Procedure Laterality Date  . APPENDECTOMY      Family History  Problem Relation Age of Onset  . Diabetes Mother     Social History    Socioeconomic History  . Marital status: Single    Spouse name: Not on file  . Number of children: 0  . Years of education: 5  . 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 Smoking status: Never Smoker  . Smokeless tobacco: Never Used  . Tobacco comment: 05/10/17 rarely  Substance and Sexual Activity  . Alcohol use: Yes    Comment: social  . Drug use: Never  . Sexual activity: Not on file  Other Topics Concern  . 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 Difficulty of Paying Living Expenses: Not on file  Food Insecurity:   . Worried About Charity fundraiser in the Last Year: Not on file  . Ran Out of Food in the Last Year: Not on file  Transportation Needs:   . Lack of Transportation (Medical): Not on file  . Lack of Transportation (Non-Medical): Not on file  Physical Activity:   . Days of Exercise per Week: Not on file  . Minutes of Exercise per Session: Not on file  Stress:   . Feeling of Stress : Not on file  Social Connections:   . Frequency of Communication with Friends and Family: Not on file  . Frequency of Social Gatherings with Friends and Family: Not on file  . Attends Religious  Services: Not on file  . Active Member of Clubs or Organizations: Not on file  . Attends Banker Meetings: Not on file  . Marital Status: Not on file     Observations/Objective: Awake, alert and oriented x 3   Review of Systems  Constitutional: Negative for fever, malaise/fatigue and weight loss.  HENT: Negative.  Negative for nosebleeds.   Eyes: Negative.  Negative for blurred vision, double vision and photophobia.  Respiratory: Negative.  Negative for cough and shortness of breath.   Cardiovascular: Negative.  Negative for chest pain,  palpitations and leg swelling.  Gastrointestinal: Negative.  Negative for heartburn, nausea and vomiting.  Musculoskeletal: Negative for myalgias.       SEE HPI  Neurological: Negative.  Negative for dizziness, focal weakness, seizures and headaches.  Psychiatric/Behavioral: Negative.  Negative for suicidal ideas.    Assessment and Plan: Timothee was seen today for knee instability  Diagnoses and all orders for this visit:  Knee instability, left -     DG Knee Complete 4 Views Left; Future Wear a knee sleeve/brace with ambulation/mobility   Follow Up Instructions Return if symptoms worsen or fail to improve.     I discussed the assessment and treatment plan with the patient. The patient was provided an opportunity to ask questions and all were answered. The patient agreed with the plan and demonstrated an understanding of the instructions.   The patient was advised to call back or seek an in-person evaluation if the symptoms worsen or if the condition fails to improve as anticipated.  I provided 17 minutes of non-face-to-face time during this encounter including median intraservice time, reviewing previous notes, labs, imaging, medications and explaining diagnosis and management.  [name], FNP-BC

## 2019-04-02 ENCOUNTER — Other Ambulatory Visit: Payer: Self-pay

## 2019-04-02 ENCOUNTER — Encounter: Payer: Self-pay | Admitting: Nurse Practitioner

## 2019-04-02 ENCOUNTER — Ambulatory Visit: Payer: Self-pay | Attending: Nurse Practitioner | Admitting: Nurse Practitioner

## 2019-04-02 VITALS — BP 138/93 | HR 73 | Temp 99.9°F | Ht 64.0 in | Wt 167.0 lb

## 2019-04-02 DIAGNOSIS — G8929 Other chronic pain: Secondary | ICD-10-CM

## 2019-04-02 DIAGNOSIS — M25512 Pain in left shoulder: Secondary | ICD-10-CM

## 2019-04-02 DIAGNOSIS — R7989 Other specified abnormal findings of blood chemistry: Secondary | ICD-10-CM

## 2019-04-02 DIAGNOSIS — M25511 Pain in right shoulder: Secondary | ICD-10-CM

## 2019-04-02 DIAGNOSIS — R350 Frequency of micturition: Secondary | ICD-10-CM

## 2019-04-02 DIAGNOSIS — N399 Disorder of urinary system, unspecified: Secondary | ICD-10-CM

## 2019-04-02 DIAGNOSIS — E785 Hyperlipidemia, unspecified: Secondary | ICD-10-CM

## 2019-04-02 DIAGNOSIS — D72829 Elevated white blood cell count, unspecified: Secondary | ICD-10-CM

## 2019-04-02 DIAGNOSIS — M25562 Pain in left knee: Secondary | ICD-10-CM

## 2019-04-02 LAB — POCT URINALYSIS DIP (CLINITEK)
Bilirubin, UA: NEGATIVE
Blood, UA: NEGATIVE
Glucose, UA: NEGATIVE mg/dL
Ketones, POC UA: NEGATIVE mg/dL
Leukocytes, UA: NEGATIVE
Nitrite, UA: NEGATIVE
POC PROTEIN,UA: NEGATIVE
Spec Grav, UA: 1.02 (ref 1.010–1.025)
Urobilinogen, UA: 0.2 E.U./dL
pH, UA: 5.5 (ref 5.0–8.0)

## 2019-04-02 MED ORDER — METHOCARBAMOL 500 MG PO TABS: 500.0000 mg | ORAL_TABLET | Freq: Four times a day (QID) | ORAL | 0 refills | Status: DC

## 2019-04-02 MED ORDER — MELOXICAM 7.5 MG PO TABS: 7.5000 mg | ORAL_TABLET | Freq: Every day | ORAL | 0 refills | Status: DC

## 2019-04-02 MED FILL — MELOXICAM 7.5 MG TABLET: 7.5 | 30 days supply | Qty: 30 | Fill #0

## 2019-04-02 MED FILL — METHOCARBAMOL 500 MG TABS: 500 | 15 days supply | Qty: 60 | Fill #0

## 2019-04-02 NOTE — Patient Instructions (Addendum)
[facility] APPT!!!!!!!!  MARCH 17th AT 9 am   ARRIVE at [address]   [facility] Okey Dupre [address] [place], [place] [address] Ph# [phone] Fax #  [phone]

## 2019-04-02 NOTE — Progress Notes (Signed)
Assessment & Plan:  Cody Fleming was seen today for arm pain and urinary frequency.  Diagnoses and all orders for this visit:  Urinary problem in male -     POCT URINALYSIS DIP (CLINITEK)  Increased urinary frequency -     Hemoglobin A1c; Future -     Hemoglobin A1c  Chronic pain of left knee -     meloxicam (MOBIC) 7.5 MG tablet; Take 1 tablet (7.5 mg total) by mouth daily. -     methocarbamol (ROBAXIN) 500 MG tablet; Take 1 tablet (500 mg total) by mouth 4 (four) times daily. -     DG Knee Complete 4 Views Left; Future  Acute pain of both shoulders -     meloxicam (MOBIC) 7.5 MG tablet; Take 1 tablet (7.5 mg total) by mouth daily. -     methocarbamol (ROBAXIN) 500 MG tablet; Take 1 tablet (500 mg total) by mouth 4 (four) times daily.  Elevated LFTs -     Hepatitis C Antibody -     Hepatic Function Panel  Dyslipidemia, goal LDL below 100 -     Lipid panel  Leukocytosis, unspecified type -     CBC    Patient has been counseled on age-appropriate routine health concerns for screening and prevention. These are reviewed and up-to-date. Referrals have been placed accordingly. Immunizations are up-to-date or declined.    Subjective:   Chief Complaint  Patient presents with  . Arm Pain    Pt. is having pain on both of his arms.  . Urinary Frequency    Pt. is not having pain when he urinating  Pt. stated he cannot hold his urine long enough.    HPI Cody Fleming 44 y.o. male presents to office today for follow up.  VRI was used to communicate directly with patient for the entire encounter including providing detailed patient instructions.    We were able to get him an appointment at the [facility] on march 17th at [place]. He was instructed not to miss this appointment.    GU Problem This has been present for a few months. He  describes the symptoms as  urge to urinate with little or no warning as well as urinary frequency.  Factors associated with symptoms include  suspected ALS. Evaluation to date includes none.Treatment to date includes none. It comes and goes. More frequent in the afternoon. Whenever he drinks water or any liquids he experiences a strong urge to urinate.  He denies any accidental leakage of urine.   Bilateral Shoulder Pain Started exercising (pulling ropes) and began to notice bilateral shoulder pain. Described as tightness as well. Pain is worse in the left shoulder. This is evaluated as a personal injury.  States he has been working out and pulling ropes for exercise lately. The pain is described as aching and tight band.  The onset of the pain was several days ago.  The pain occurs when active.  Location is global. No history of dislocation. Symptoms are aggravated by reaching, lifting, pulling, pushing, twisting, repetitive use, exercise. Symptoms are diminished by  rest.   Limited activities include: no limitations.   Left Knee Pain Chronic. Inciting event: none known. Current symptoms include crepitus sensation, popping sensation and stiffness. Pain is aggravated by bending, squatting.  Patient has had prior knee problems. Evaluation to date: none. Treatment to date: none.    Review of Systems  Constitutional: Negative for fever, malaise/fatigue and weight loss.  HENT: Negative.  Negative for  nosebleeds.   Eyes: Negative.  Negative for blurred vision, double vision and photophobia.  Respiratory: Negative.  Negative for cough and shortness of breath.   Cardiovascular: Negative.  Negative for chest pain, palpitations and leg swelling.  Gastrointestinal: Negative.  Negative for heartburn, nausea and vomiting.  Genitourinary: Positive for frequency.  Musculoskeletal: Positive for joint pain. Negative for myalgias.  Neurological: Positive for weakness. Negative for dizziness, focal weakness, seizures and headaches.  Psychiatric/Behavioral: Negative.  Negative for suicidal ideas.     Past Medical History:  Diagnosis Date  . ALS  (amyotrophic lateral sclerosis) ([facility])   . Hypertension     Past Surgical History:  Procedure Laterality Date  . APPENDECTOMY      Family History  Problem Relation Age of Onset  . Diabetes Mother     Social History Reviewed with no changes to be made today.   Outpatient Medications Prior to Visit  Medication Sig Dispense Refill  . Acetaminophen (TYLENOL PO) Take by mouth.    . fluticasone (FLONASE) 50 MCG/ACT nasal spray Place 2 sprays into both nostrils daily. 16 g 1  . pravastatin (PRAVACHOL) 20 MG tablet Take 1 tablet (20 mg total) by mouth daily. 90 tablet 3  . benzonatate (TESSALON) 200 MG capsule Take 1 capsule (200 mg total) by mouth 2 (two) times daily as needed for cough. (Patient not taking: Reported on 04/02/2019) 30 capsule 0  . guaiFENesin-dextromethorphan (ROBITUSSIN DM) 100-10 MG/5ML syrup Take 5 mLs by mouth every 4 (four) hours as needed for cough. (Patient not taking: Reported on 04/02/2019) 236 mL 0   No facility-administered medications prior to visit.    Allergies  Allergen Reactions  . Contrast Media [Iodinated Diagnostic Agents] Nausea And Vomiting       Objective:    BP (!) 138/93 (BP Location: Right Arm, Patient Position: Sitting, Cuff Size: Normal)   Pulse 73   Temp 99.9 F (37.7 C) (Temporal)   Ht 5\' 4"  (1.626 m)   Wt 167 lb (75.8 kg)   SpO2 99%   BMI 28.67 kg/m  Wt Readings from Last 3 Encounters:  04/02/19 167 lb (75.8 kg)  05/10/17 162 lb (73.5 kg)  05/16/16 169 lb (76.7 kg)    Physical Exam Vitals and nursing note reviewed.  Constitutional:      Appearance: He is well-developed.  HENT:     Head: Normocephalic and atraumatic.  Cardiovascular:     Rate and Rhythm: Normal rate and regular rhythm.     Heart sounds: Normal heart sounds. No murmur. No friction rub. No gallop.   Pulmonary:     Effort: Pulmonary effort is normal. No tachypnea or respiratory distress.     Breath sounds: Normal breath sounds. No decreased breath sounds,  wheezing, rhonchi or rales.  Chest:     Chest wall: No tenderness.  Abdominal:     General: Bowel sounds are normal.     Palpations: Abdomen is soft.  Musculoskeletal:        General: No swelling, tenderness, deformity or signs of injury. Normal range of motion.     Cervical back: Normal range of motion.     Right knee: Normal.     Left knee: Normal. No swelling. Normal range of motion. No tenderness.     Right lower leg: No edema.     Left lower leg: No edema.  Skin:    General: Skin is warm and dry.  Neurological:     Mental Status: He is alert and oriented  to person, place, and time.     Coordination: Coordination normal.  Psychiatric:        Behavior: Behavior normal. Behavior is cooperative.        Thought Content: Thought content normal.        Judgment: Judgment normal.          Patient has been counseled extensively about nutrition and exercise as well as the importance of adherence with medications and regular follow-up. The patient was given clear instructions to go to ER or return to medical center if symptoms don't improve, worsen or new problems develop. The patient verbalized understanding.   Follow-up: Return if symptoms worsen or fail to improve.   [name], FNP-BC [facility] and [facility] [place], [place] [phone]

## 2019-04-03 LAB — CBC
Hematocrit: 45.4 % (ref 37.5–51.0)
Hemoglobin: 14.9 g/dL (ref 13.0–17.7)
MCH: 28 pg (ref 26.6–33.0)
MCHC: 32.8 g/dL (ref 31.5–35.7)
MCV: 85 fL (ref 79–97)
Platelets: 380 10*3/uL (ref 150–450)
RBC: 5.32 x10E6/uL (ref 4.14–5.80)
RDW: 13.1 % (ref 11.6–15.4)
WBC: 11.1 10*3/uL — ABNORMAL HIGH (ref 3.4–10.8)

## 2019-04-03 LAB — LIPID PANEL
Chol/HDL Ratio: 6 ratio — ABNORMAL HIGH (ref 0.0–5.0)
Cholesterol, Total: 199 mg/dL (ref 100–199)
HDL: 33 mg/dL — ABNORMAL LOW (ref >39)
LDL Chol Calc (NIH): 121 mg/dL — ABNORMAL HIGH (ref 0–99)
Triglycerides: 257 mg/dL — ABNORMAL HIGH (ref 0–149)
VLDL Cholesterol Cal: 45 mg/dL — ABNORMAL HIGH (ref 5–40)

## 2019-04-03 LAB — HEMOGLOBIN A1C
Est. average glucose Bld gHb Est-mCnc: 126 mg/dL
Hgb A1c MFr Bld: 6 % — ABNORMAL HIGH (ref 4.8–5.6)

## 2019-04-03 LAB — HEPATIC FUNCTION PANEL
ALT: 114 IU/L — ABNORMAL HIGH (ref 0–44)
AST: 49 IU/L — ABNORMAL HIGH (ref 0–40)
Albumin: 4.7 g/dL (ref 4.0–5.0)
Alkaline Phosphatase: 96 IU/L (ref 39–117)
Bilirubin Total: 0.4 mg/dL (ref 0.0–1.2)
Bilirubin, Direct: 0.1 mg/dL (ref 0.00–0.40)
Total Protein: 7.6 g/dL (ref 6.0–8.5)

## 2019-04-03 LAB — HEPATITIS C ANTIBODY: Hep C Virus Ab: <0.1 s/co ratio (ref 0.0–0.9)

## 2019-04-11 ENCOUNTER — Telehealth: Payer: Self-pay | Admitting: Nurse Practitioner

## 2019-04-11 NOTE — Telephone Encounter (Signed)
Patient returned nurse call regarding labs results. Please f/u

## 2019-04-12 NOTE — Telephone Encounter (Signed)
Pt. Was informed on lab results.  Pt. Understood.  [place] [employer] interpreter assist with the call.

## 2019-05-05 ENCOUNTER — Encounter: Payer: Self-pay | Admitting: Nurse Practitioner

## 2019-07-14 ENCOUNTER — Other Ambulatory Visit: Payer: Self-pay | Admitting: Nurse Practitioner

## 2019-07-14 DIAGNOSIS — R7989 Other specified abnormal findings of blood chemistry: Secondary | ICD-10-CM

## 2019-07-14 DIAGNOSIS — D72829 Elevated white blood cell count, unspecified: Secondary | ICD-10-CM

## 2019-07-14 DIAGNOSIS — E785 Hyperlipidemia, unspecified: Secondary | ICD-10-CM

## 2019-07-16 ENCOUNTER — Other Ambulatory Visit: Payer: Self-pay

## 2019-07-16 ENCOUNTER — Ambulatory Visit: Payer: Self-pay | Attending: Nurse Practitioner

## 2019-07-16 DIAGNOSIS — R7989 Other specified abnormal findings of blood chemistry: Secondary | ICD-10-CM

## 2019-07-16 DIAGNOSIS — D72829 Elevated white blood cell count, unspecified: Secondary | ICD-10-CM

## 2019-07-16 DIAGNOSIS — E785 Hyperlipidemia, unspecified: Secondary | ICD-10-CM

## 2019-07-17 LAB — CMP14+EGFR
ALT: 53 IU/L — ABNORMAL HIGH (ref 0–44)
AST: 33 IU/L (ref 0–40)
Albumin/Globulin Ratio: 1.6 (ref 1.2–2.2)
Albumin: 5.1 g/dL — ABNORMAL HIGH (ref 4.0–5.0)
Alkaline Phosphatase: 102 IU/L (ref 48–121)
BUN/Creatinine Ratio: 20 (ref 9–20)
BUN: 16 mg/dL (ref 6–24)
Bilirubin Total: 0.4 mg/dL (ref 0.0–1.2)
CO2: 19 mmol/L — ABNORMAL LOW (ref 20–29)
Calcium: 9.9 mg/dL (ref 8.7–10.2)
Chloride: 103 mmol/L (ref 96–106)
Creatinine, Ser: 0.81 mg/dL (ref 0.76–1.27)
GFR calc Af Amer: 128 mL/min/{1.73_m2} (ref >59)
GFR calc non Af Amer: 110 mL/min/{1.73_m2} (ref >59)
Globulin, Total: 3.2 g/dL (ref 1.5–4.5)
Glucose: 95 mg/dL (ref 65–99)
Potassium: 4.7 mmol/L (ref 3.5–5.2)
Sodium: 143 mmol/L (ref 134–144)
Total Protein: 8.3 g/dL (ref 6.0–8.5)

## 2019-07-17 LAB — LIPID PANEL
Chol/HDL Ratio: 5.8 ratio — ABNORMAL HIGH (ref 0.0–5.0)
Cholesterol, Total: 184 mg/dL (ref 100–199)
HDL: 32 mg/dL — ABNORMAL LOW (ref >39)
LDL Chol Calc (NIH): 105 mg/dL — ABNORMAL HIGH (ref 0–99)
Triglycerides: 272 mg/dL — ABNORMAL HIGH (ref 0–149)
VLDL Cholesterol Cal: 47 mg/dL — ABNORMAL HIGH (ref 5–40)

## 2019-07-17 LAB — CBC WITH DIFFERENTIAL/PLATELET
Basophils Absolute: 0.1 10*3/uL (ref 0.0–0.2)
Basos: 1 %
EOS (ABSOLUTE): 0.3 10*3/uL (ref 0.0–0.4)
Eos: 3 %
Hematocrit: 44.7 % (ref 37.5–51.0)
Hemoglobin: 15 g/dL (ref 13.0–17.7)
Immature Grans (Abs): 0 10*3/uL (ref 0.0–0.1)
Immature Granulocytes: 0 %
Lymphocytes Absolute: 5.5 10*3/uL — ABNORMAL HIGH (ref 0.7–3.1)
Lymphs: 44 %
MCH: 28.5 pg (ref 26.6–33.0)
MCHC: 33.6 g/dL (ref 31.5–35.7)
MCV: 85 fL (ref 79–97)
Monocytes Absolute: 0.8 10*3/uL (ref 0.1–0.9)
Monocytes: 7 %
Neutrophils Absolute: 5.8 10*3/uL (ref 1.4–7.0)
Neutrophils: 45 %
Platelets: 279 10*3/uL (ref 150–450)
RBC: 5.27 x10E6/uL (ref 4.14–5.80)
RDW: 12.8 % (ref 11.6–15.4)
WBC: 12.6 10*3/uL — ABNORMAL HIGH (ref 3.4–10.8)

## 2019-07-23 ENCOUNTER — Ambulatory Visit: Payer: Self-pay | Attending: Nurse Practitioner

## 2019-07-23 ENCOUNTER — Other Ambulatory Visit: Payer: Self-pay

## 2019-07-23 DIAGNOSIS — N399 Disorder of urinary system, unspecified: Secondary | ICD-10-CM

## 2019-07-23 LAB — POCT URINALYSIS DIP (CLINITEK)
Bilirubin, UA: NEGATIVE
Blood, UA: NEGATIVE
Glucose, UA: NEGATIVE mg/dL
Ketones, POC UA: NEGATIVE mg/dL
Leukocytes, UA: NEGATIVE
Nitrite, UA: NEGATIVE
POC PROTEIN,UA: NEGATIVE
Spec Grav, UA: 1.025 (ref 1.010–1.025)
Urobilinogen, UA: 0.2 E.U./dL
pH, UA: 5.5 (ref 5.0–8.0)

## 2019-07-24 LAB — URINE CYTOLOGY ANCILLARY ONLY
Bacterial Vaginitis-Urine: NEGATIVE
Candida Urine: NEGATIVE
Chlamydia: NEGATIVE
Comment: NEGATIVE
Comment: NEGATIVE
Comment: NORMAL
Neisseria Gonorrhea: NEGATIVE
Trichomonas: NEGATIVE

## 2019-09-26 ENCOUNTER — Telehealth: Payer: Self-pay | Admitting: Nurse Practitioner

## 2019-09-26 NOTE — Telephone Encounter (Signed)
Please follow up Copied from CRM #[id number]. Topic: General - Other >> Sep 24, 2019  3:56 PM Lyn Hollingshead D wrote: PT need an appt with Mikle Bosworth / for the orange card, please advise

## 2019-09-26 NOTE — Telephone Encounter (Signed)
I called back the PT, LVM, inform him that he need to schedule and appt with the PCP as well an appt with financial

## 2019-10-09 ENCOUNTER — Ambulatory Visit: Payer: Self-pay

## 2019-10-18 ENCOUNTER — Other Ambulatory Visit: Payer: Self-pay

## 2019-10-18 ENCOUNTER — Ambulatory Visit: Payer: Self-pay

## 2019-11-12 ENCOUNTER — Ambulatory Visit: Payer: Self-pay | Attending: Family | Admitting: Family

## 2019-11-12 ENCOUNTER — Other Ambulatory Visit: Payer: Self-pay

## 2019-11-12 DIAGNOSIS — M25512 Pain in left shoulder: Secondary | ICD-10-CM

## 2019-11-12 DIAGNOSIS — Z0120 Encounter for dental examination and cleaning without abnormal findings: Secondary | ICD-10-CM

## 2019-11-12 DIAGNOSIS — G8929 Other chronic pain: Secondary | ICD-10-CM

## 2019-11-12 DIAGNOSIS — M25511 Pain in right shoulder: Secondary | ICD-10-CM

## 2019-11-12 MED ORDER — NAPROXEN 500 MG PO TABS: 500.0000 mg | ORAL_TABLET | Freq: Two times a day (BID) | ORAL | 0 refills | Status: DC

## 2019-11-12 MED ORDER — METHOCARBAMOL 500 MG PO TABS: 500.0000 mg | ORAL_TABLET | Freq: Three times a day (TID) | ORAL | 0 refills | Status: DC | PRN

## 2019-11-12 MED FILL — ?NAPROXEN 500 MG TABS: 500 | 30 days supply | Qty: 60 | Fill #0

## 2019-11-12 MED FILL — ?METHOCARBAMOL 500 MG TABLE: 500 | 20 days supply | Qty: 60 | Fill #0

## 2019-11-12 NOTE — Patient Instructions (Signed)
Naproxen and Robaxin for chronic shoulder pain. Follow-up with primary provider in 6 weeks or sooner if needed. Referral to Dentistry.   Naproxeno y robaxina para el dolor crnico de hombro. Haga un seguimiento con el proveedor de atencin primaria en 6 semanas o antes si es necesario. Remisin a Odontologa.  Dolor en el hombro Shoulder Pain Muchas cosas pueden provocar dolor en el hombro, por ejemplo:  Una lesin.  Un movimiento del hombro que se repite [place] y [place] vez de la misma manera (uso excesivo).  Dolor en las articulaciones (artritis). El dolor puede deberse a lo siguiente:  Hinchazn e irritacin (inflamacin) de alguna parte del hombro.  Una lesin en la articulacin del hombro.  Una lesin en: ? Los tejidos que conectan el msculo al hueso (tendones). ? Los tejidos que [employer] s (ligamentos). ? Los [employer]. Siga estas indicaciones en su casa: Controle los cambios en sus sntomas. Informe a su mdico acerca de los cambios. Estas indicaciones pueden ayudarlo con Chief Technology Officer. Si tiene un cabestrillo:  Use el cabestrillo como se lo haya indicado el mdico. Quteselo solamente como se lo haya indicado el mdico.  Afloje el cabestrillo si los dedos: ? Hormiguean. ? Se adormecen. ? Se tornan fros y de [employer].  Mantenga el cabestrillo limpio.  Si el cabestrillo no es impermeable: ? No deje que se moje. ? Qutese el cabestrillo para ducharse o baarse. Control del dolor, la rigidez y la hinchazn   Si se lo indican, aplique hielo sobre la zona dolorida: ? Nature conservation officer hielo en una bolsa plstica. ? Coloque una [employer] piel y Copy. ? Coloque el hielo durante , 2 a 3veces por da. Deje de aplicarse hielo si no ayuda a Engineer, materials.  Apriete una pelota blanda o una almohadilla de goma tanto como sea posible. Esto impide que el hombro se hinche. Tambin ayuda a Medical sales representative. Indicaciones generales  Intel Corporation de venta libre y los recetados solamente como se lo haya indicado el mdico.  Oceanographer a todas las visitas de seguimiento como se lo haya indicado el mdico. Esto es importante. Comunquese con un mdico si:  El Product/process development scientist.  Los medicamentos no Tourist information centre manager.  Siente un dolor nuevo en el brazo, la mano o los dedos. Solicite ayuda inmediatamente si:  El brazo, la mano o los dedos: ? Hormiguean. ? Estn adormecidos. ? Estn hinchados. ? Estn doloridos. ? Se tornan de color blanco o azul. Resumen  Varias pueden ser las causas del dolor en el hombro. Estas incluyen lesiones, mover el hombro en el mismo sentido una y Laverda Page, y Engineer, mining en las articulaciones.  Controle los cambios en sus sntomas. Informe a su mdico acerca de los cambios.  Esta afeccin se puede tratar con un cabestrillo, hielo y un medicamento para Chief Technology Officer.  Comunquese con su mdico si el dolor empeora o tiene un dolor nuevo. Solicite ayuda de inmediato si el brazo, la mano o los dedos se le adormecen o si siente hormigueo, se le hinchan o le duelen.  Concurra a todas las visitas de [address] se lo haya indicado el mdico. Esto es importante. Esta informacin no tiene Theme park manager el consejo del mdico. Asegrese de hacerle al mdico cualquier pregunta que tenga. Document Revised: 09/20/2017 Document Reviewed: 09/20/2017 Elsevier Patient Education  2020 [employer].

## 2019-11-12 NOTE — Progress Notes (Signed)
Virtual Visit via Telephone Note  I connected with Cody Fleming, on 11/12/2019 at 3:30 PM by telephone due to the COVID-19 pandemic and verified that I am speaking with the correct person using two identifiers.  Due to current restrictions/limitations of in-office visits due to the COVID-19 pandemic, this scheduled clinical appointment was converted to a telehealth visit.   Consent: I discussed the limitations, risks, security and privacy concerns of performing an evaluation and management service by telephone and the availability of in person appointments. I also discussed with the patient that there may be a patient responsible charge related to this service. The patient expressed understanding and agreed to proceed.  Location of Patient: Home  Location of Provider: [facility]  Persons participating in Telemedicine visit: Orazio Hartwell Ricky Stabs, NP Margorie John, CMA [employer], Name: Cody Fleming, ID#: [id number]   History of Present Illness: Cody Fleming is a 44 year old male who presents for arm pain and neck pain.  1. ARM PAIN: 04/02/2019: Visit with nurse practitioner Meredeth Ide. During that encounter Meloxicam and Methocarbamol for arm pain.  11/12/2019: Duration: chronic Location: shoulder Mechanism of injury: unknown Onset: gradual Severity: 7/10  Frequency: intermittent Radiation: yes back and neck Aggravating factors: movement  Alleviating factors: APAP  Status: worse Treatments attempted: APAP  Relief with NSAIDs?:  No NSAIDs Taken Swelling: no Redness: no  Warmth: no Trauma: no Muscle spasms: sometimes  Shortness of breath: no  Fever: no Decreased sensation: no Paresthesias: no Weakness: no  2. DENTAL REQUEST: Patient with concern of cavities. Denies pain. Requests referral to dentist.   Past Medical History:  Diagnosis Date  . ALS (amyotrophic lateral sclerosis) ([facility])   . Hypertension     Allergies  Allergen Reactions  . Contrast Media [Iodinated Diagnostic Agents] Nausea And Vomiting    Current Outpatient Medications on File Prior to Visit  Medication Sig Dispense Refill  . Acetaminophen (TYLENOL PO) Take by mouth.    . fluticasone (FLONASE) 50 MCG/ACT nasal spray Place 2 sprays into both nostrils daily. 16 g 1  . meloxicam (MOBIC) 7.5 MG tablet Take 1 tablet (7.5 mg total) by mouth daily. 30 tablet 0  . methocarbamol (ROBAXIN) 500 MG tablet Take 1 tablet (500 mg total) by mouth 4 (four) times daily. 60 tablet 0  . pravastatin (PRAVACHOL) 20 MG tablet Take 1 tablet (20 mg total) by mouth daily. 90 tablet 3   No current facility-administered medications on file prior to visit.    Observations/Objective: Alert and oriented x 3. Not in acute distress. Physical examination not completed as this is a telemedicine visit.  Assessment and Plan: 1. Chronic pain of both shoulders:  - Naproxen for chronic shoulder pain. Take with food and plenty of water. Do not take any other products containing ibuprofen, naprosyn and aspirin.  - Methocarbamol for muscle spasm related to chronic shoulder pain. May cause drowsiness. Counseled to not consume if operating heavy machinery or driving. Counseled to not consume with alcohol and illicit substances. Patient verbalized understanding. - Follow-up with primary provider in 4 to 6 weeks or sooner if needed. - naproxen (NAPROSYN) 500 MG tablet; Take 1 tablet (500 mg total) by mouth 2 (two) times daily with a meal.  Dispense: 60 tablet; Refill: 0 - methocarbamol (ROBAXIN) 500 MG tablet; Take 1 tablet (500 mg total) by mouth every 8 (eight) hours as needed for muscle spasms.  Dispense: 60 tablet; Refill: 0  2. Encounter for routine dental examination: - Patient with concerns  of cavities. Denies pain. - Per patient request referral to Dentistry for further evaluation and management. - Ambulatory referral to Dentistry  Follow Up  Instructions: Follow-up with primary provider in 4 to 6 weeks or sooner if needed.   Patient was given clear instructions to go to Emergency Department or return to medical center if symptoms don't improve, worsen, or new problems develop.The patient verbalized understanding.  I discussed the assessment and treatment plan with the patient. The patient was provided an opportunity to ask questions and all were answered. The patient agreed with the plan and demonstrated an understanding of the instructions.   The patient was advised to call back or seek an in-person evaluation if the symptoms worsen or if the condition fails to improve as anticipated.   I provided 30 minutes total of non-face-to-face time during this encounter including median intraservice time, reviewing previous notes, labs, imaging, medications, management and patient verbalized understanding.    Rema Fendt, NP  [facility] and [facility] [place], [place] [phone]   11/12/2019, 8:19 AM

## 2019-11-19 ENCOUNTER — Encounter: Payer: Self-pay | Admitting: *Deleted

## 2019-11-20 NOTE — Congregational Nurse Program (Signed)
  Dept: [phone]   Congregational Nurse Program Note  Date of Encounter: 11/19/2019  Past Medical History: Past Medical History:  Diagnosis Date  . ALS (amyotrophic lateral sclerosis) ([facility])   . Hypertension     Encounter Details:  CNP Questionnaire - 11/20/19 1616      Questionnaire   Do you give verbal consent to treat you today? Yes    Visit Setting Church or Organization    Location Patient Served At [facility]    Patient Status Not Applicable    Medical Provider Yes    Insurance Uninsured (Includes [facility] Card/Care [place])    Intervention Assess (including screenings);Counsel    Food Have food insecurities    Medication Have medication insecurities          Client, his wife, his 2yo dtr and 36 yo nephew came into [facility] today.  BP second attempt 124/90 P69.  He has ALS.  He speaks [place].  Lorenso Courier assisted with interpreting during discussion.  Client would like to be able to take a medication Riluzole/Rilutek, but it is very expensive.  He would like me to investigate if there is a way he can get the medication.  He does not have a prescription.  I will call several pharmacies to inquire about the medication, the cost, whether a prescription is needed and is there a way to get a voucher or reduced cost for the medication.  I will follow up with client at [phone] when I have information to share.  Roderic Palau, RN, MSN, CNP [phone] Office [phone] Cell

## 2020-11-02 ENCOUNTER — Ambulatory Visit: Payer: Self-pay | Admitting: *Deleted

## 2020-11-02 NOTE — Telephone Encounter (Signed)
Reason for Disposition  [1] MODERATE headache (e.g., interferes with normal activities) AND [2] present > 24 hours AND [3] unexplained  (Exceptions: analgesics not tried, typical migraine, or headache part of viral illness)  Nausea lasts > 1 week  Answer Assessment - Initial Assessment Questions 1. NAUSEA SEVERITY: "How bad is the nausea?" (e.g., mild, moderate, severe; dehydration, weight loss)   - MILD: loss of appetite without change in eating habits   - MODERATE: decreased oral intake without significant weight loss, dehydration, or malnutrition   - SEVERE: inadequate caloric or fluid intake, significant weight loss, symptoms of dehydration     Mild/moderate 2. ONSET: "When did the nausea begin?"     2 weeks 3. VOMITING: "Any vomiting?" If Yes, ask: "How many times today?"     no 4. RECURRENT SYMPTOM: "Have you had nausea before?" If Yes, ask: "When was the last time?" "What happened that time?"     no 5. CAUSE: "What do you think is causing the nausea?"     Something in stomach-all day 6. PREGNANCY: "Is there any chance you are pregnant?" (e.g., unprotected intercourse, missed birth control pill, broken condom)     N/a  Answer Assessment - Initial Assessment Questions 1. LOCATION: "Where does it hurt?"     Right and left 2. ONSET: "When did the headache start?" (Minutes, hours or days)      2 weeks 3. PATTERN: "Does the pain come and go, or has it been constant since it started?"     Headache occurs with nausea 4. SEVERITY: "How bad is the pain?" and "What does it keep you from doing?"  (e.g., Scale 1-10; mild, moderate, or severe)   - MILD (1-3): doesn't interfere with normal activities    - MODERATE (4-7): interferes with normal activities or awakens from sleep    - SEVERE (8-10): excruciating pain, unable to do any normal activities        5-6 5. RECURRENT SYMPTOM: "Have you ever had headaches before?" If Yes, ask: "When was the last time?" and "What happened that time?"       no 6. CAUSE: "What do you think is causing the headache?"     unsure 7. MIGRAINE: "Have you been diagnosed with migraine headaches?" If Yes, ask: "Is this headache similar?"      no 8. HEAD INJURY: "Has there been any recent injury to the head?"      no 9. OTHER SYMPTOMS: "Do you have any other symptoms?" (fever, stiff neck, eye pain, sore throat, cold symptoms)     Blurry vision 10. PREGNANCY: "Is there any chance you are pregnant?" "When was your last menstrual period?"       N/a  Protocols used: Nausea-A-AH, [facility]

## 2020-11-02 NOTE — Telephone Encounter (Signed)
Patient is calling with complaint of headache, nausea, vision changes. Patient reports he has never been diagnosed with high BP or diabetes. Patient advised UC for evaluation- not knowing the cause of symptoms- may be something that needs attention now.

## 2020-11-08 ENCOUNTER — Encounter: Payer: Self-pay | Admitting: *Deleted

## 2020-11-09 NOTE — Congregational Nurse Program (Signed)
  Dept: [phone]   Congregational Nurse Program Note  Date of Encounter: 11/08/2020  Past Medical History: Past Medical History:  Diagnosis Date   ALS (amyotrophic lateral sclerosis) (HCC)    Hypertension     Encounter Details:  CNP Questionnaire - 11/08/20 1400       Questionnaire   Do you give verbal consent to treat you today? Yes    Location Patient Served  [facility]    Visit Setting Church or Organization    Patient Status Immigrant    Insurance Uninsured ([employer]/Self-Pay)    Insurance Referral N/A    Medication Have Medication Insecurities    Medical Provider Yes    Screening Referrals N/A    Medical Referral N/A    Medical Appointment Made N/A    Food Have Food Insecurities    Transportation Need transportation assistance    Housing/Utilities N/A    Interpersonal Safety N/A    Intervention Blood pressure;Advocate;Counsel;Educate;Support    ED Visit Averted Yes    Life-Saving Intervention Made N/A            Cient came into clinic for flu shot today and for BP check.  He is on BP medication per wife.  Interpreter used for [place] translation.  Client shared that he is trying to change his neurologist to [place] from [place].  Client reports that he can feed himself solids but has to be fed soups and liquid foods.  He is able to ambulate but cannot lift his arms up without assistance.  This CN has not seen client in about a year.  Gave him my business card and translator [employer] number.  Encouraged him to follow up with [place] as needed.  Roderic Palau, RN, MSN, CNP [phone] Office [phone] Cell

## 2022-09-26 ENCOUNTER — Ambulatory Visit (INDEPENDENT_AMBULATORY_CARE_PROVIDER_SITE_OTHER): Payer: Self-pay | Admitting: Adult Health

## 2022-09-26 ENCOUNTER — Encounter: Payer: Self-pay | Admitting: Adult Health

## 2022-09-26 VITALS — BP 110/76 | HR 94 | Temp 98.1°F | Resp 16 | Ht 64.0 in | Wt 155.8 lb

## 2022-09-26 DIAGNOSIS — Z113 Encounter for screening for infections with a predominantly sexual mode of transmission: Secondary | ICD-10-CM

## 2022-09-26 DIAGNOSIS — Z0000 Encounter for general adult medical examination without abnormal findings: Secondary | ICD-10-CM

## 2022-09-26 DIAGNOSIS — I10 Essential (primary) hypertension: Secondary | ICD-10-CM

## 2022-09-26 DIAGNOSIS — G1221 Amyotrophic lateral sclerosis: Secondary | ICD-10-CM

## 2022-09-26 DIAGNOSIS — R1310 Dysphagia, unspecified: Secondary | ICD-10-CM

## 2022-09-26 DIAGNOSIS — H04123 Dry eye syndrome of bilateral lacrimal glands: Secondary | ICD-10-CM

## 2022-09-26 DIAGNOSIS — G35 Multiple sclerosis: Secondary | ICD-10-CM

## 2022-09-26 DIAGNOSIS — Z7689 Persons encountering health services in other specified circumstances: Secondary | ICD-10-CM

## 2022-09-26 MED ORDER — ARTIFICIAL TEARS OP SOLN: 1.0000 [drp] | Freq: Three times a day (TID) | OPHTHALMIC | Status: AC | PRN

## 2022-09-26 NOTE — Patient Instructions (Signed)

## 2022-09-26 NOTE — Progress Notes (Unsigned)
[facility] clinic  Provider: Kenard Gower DNP  Code Status:  Full Code  Goals of Care:     05/16/2016   11:57 PM  Advanced Directives  Does Cody Fleming Have a Medical Advance Directive? No     Chief Complaint  Cody Fleming presents with   Establish Care    New Cody Fleming.     HPI: Cody Fleming is a 47 y.o. male seen today to establish care with [facility]. He was accompanied by his wife, daughter and a Spanish interpreter. He is [place] and came to [place] when he was 47 years old. He completed up to grade 5 in school. 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 He is currently not working due to disability. He was diagnosed with Multiple Sclerosis in 10/10/2008, In 2010/10/11, when his nephew died, he had shortness of breath. He then started to get nauseous whenever he gets out so he went to the doctor and was diagnosed with hypertension. He used to take Lisinopril 20 mg daily for hypertension but stopped a month ago since his doctor's office closed. He follows up with [facility] neurology, Dr. Janan Ridge. He uses a mask at home but wife and Cody Fleming does not know the name but it helps him breath. He is number 5 among 7 children, 3 sisters and 3 brothers.  His father is still alive at 48 Y/O, healthy. His mother died 11 months ago at 66 Y/O due to stroke and , also, she has diabetes mellitus. He has a daughter who is turning 37 years old next month.   He is not able to move his BUE and walks with bent neck but when prompted able to lift his head up. He walks at home and wife assists him when he goes out of the house. He can only drink using a straw and eats regular diet with chopped up meat and vegetables. He lives with wife and sister-in-law. He does not smoke, stopped 5 years ago. He drinks alcohol once every 2 months. He stopped driving 2 years ago. Marland Kitchen He complains of having dry eyes and wife uses chamomile drops.   Past Medical History:  Diagnosis Date   ALS (amyotrophic lateral sclerosis) (HCC)     Hypertension     Past Surgical History:  Procedure Laterality Date   APPENDECTOMY      Allergies  Allergen Reactions   Contrast Media [Iodinated Contrast Media] Nausea And Vomiting    Outpatient Encounter Medications as of 09/26/2022  Medication Sig   Acetaminophen (TYLENOL PO) Take by mouth as needed.   [DISCONTINUED] fluticasone (FLONASE) 50 MCG/ACT nasal spray Place 2 sprays into both nostrils daily.   [DISCONTINUED] methocarbamol (ROBAXIN) 500 MG tablet Take 1 tablet (500 mg total) by mouth every 8 (eight) hours as needed for muscle spasms.   [DISCONTINUED] naproxen (NAPROSYN) 500 MG tablet Take 1 tablet (500 mg total) by mouth 2 (two) times daily with a meal.   [DISCONTINUED] pravastatin (PRAVACHOL) 20 MG tablet Take 1 tablet (20 mg total) by mouth daily.   No facility-administered encounter medications on file as of 09/26/2022.    Review of Systems:  Review of Systems  Constitutional:  Negative for activity change, appetite change and fever.  HENT:  Negative for sore throat.   Eyes:  Positive for itching. Negative for visual disturbance.  Cardiovascular:  Negative for chest pain and leg swelling.  Gastrointestinal:  Negative for abdominal distention, diarrhea and vomiting.  Genitourinary:  Negative for dysuria, frequency and urgency.  Skin:  Negative for color change.  Neurological:  Positive for weakness. Negative for dizziness and headaches.  Psychiatric/Behavioral:  Negative for behavioral problems and sleep disturbance. The Cody Fleming is not nervous/anxious.     Health Maintenance  Topic Date Due   HIV Screening  Never done   DTaP/Tdap/Td (1 - Tdap) Never done   COVID-19 Vaccine (1 - 2023-24 season) Never done   INFLUENZA VACCINE  09/01/2022   Hepatitis C Screening  Completed   HPV VACCINES  Aged Out    Physical Exam: Vitals:   09/26/22 1424  BP: 110/76  Pulse: 94  Resp: 16  Temp: 98.1 F (36.7 C)  SpO2: 97%  Weight: 155 lb 12.8 oz (70.7 kg)  Height: 5'  4" (1.626 m)   Body mass index is 26.74 kg/m. Physical Exam Constitutional:      Appearance: Normal appearance.  HENT:     Head: Normocephalic and atraumatic.     Mouth/Throat:     Mouth: Mucous membranes are moist.  Eyes:     Conjunctiva/sclera: Conjunctivae normal.  Cardiovascular:     Rate and Rhythm: Normal rate and regular rhythm.     Pulses: Normal pulses.     Heart sounds: Normal heart sounds.  Pulmonary:     Effort: Pulmonary effort is normal.     Breath sounds: Normal breath sounds.  Abdominal:     General: Bowel sounds are normal.     Palpations: Abdomen is soft.  Musculoskeletal:        General: No swelling.     Cervical back: Normal range of motion.     Comments: Unable to move BUE, walks with neck bent forward. Slightly slurred speech.  Skin:    General: Skin is warm and dry.  Neurological:     General: No focal deficit present.     Mental Status: He is alert and oriented to person, place, and time.  Psychiatric:        Mood and Affect: Mood normal.        Behavior: Behavior normal.        Thought Content: Thought content normal.        Judgment: Judgment normal.     Labs reviewed: Basic Metabolic Panel: No results for input(s): "NA", "K", "CL", "CO2", "GLUCOSE", "BUN", "CREATININE", "CALCIUM", "MG", "PHOS", "TSH" in the last 8760 hours. Liver Function Tests: No results for input(s): "AST", "ALT", "ALKPHOS", "BILITOT", "PROT", "ALBUMIN" in the last 8760 hours. No results for input(s): "LIPASE", "AMYLASE" in the last 8760 hours. No results for input(s): "AMMONIA" in the last 8760 hours. CBC: No results for input(s): "WBC", "NEUTROABS", "HGB", "HCT", "MCV", "PLT" in the last 8760 hours. Lipid Panel: No results for input(s): "CHOL", "HDL", "LDLCALC", "TRIG", "CHOLHDL", "LDLDIRECT" in the last 8760 hours. Lab Results  Component Value Date   HGBA1C 6.0 (H) 04/02/2019    Procedures since last visit: No results  found.  Assessment/Plan      Labs/tests ordered:    Next appt:  Visit date not found

## 2022-09-27 ENCOUNTER — Other Ambulatory Visit: Payer: Self-pay

## 2022-09-27 DIAGNOSIS — Z113 Encounter for screening for infections with a predominantly sexual mode of transmission: Secondary | ICD-10-CM

## 2022-09-27 DIAGNOSIS — I10 Essential (primary) hypertension: Secondary | ICD-10-CM

## 2022-09-28 LAB — CBC WITH DIFFERENTIAL/PLATELET
Absolute Monocytes: 497 {cells}/uL (ref 200–950)
Basophils Absolute: 86 cells/uL (ref 0–200)
Basophils Relative: 0.8 %
Eosinophils Absolute: 410 {cells}/uL (ref 15–500)
Eosinophils Relative: 3.8 %
HCT: 41.1 % (ref 38.5–50.0)
Hemoglobin: 13.9 g/dL (ref 13.2–17.1)
Lymphs Abs: 4450 {cells}/uL — ABNORMAL HIGH (ref 850–3900)
MCH: 28.5 pg (ref 27.0–33.0)
MCHC: 33.8 g/dL (ref 32.0–36.0)
MCV: 84.2 fL (ref 80.0–100.0)
MPV: 10.4 fL (ref 7.5–12.5)
Monocytes Relative: 4.6 %
Neutro Abs: 5357 cells/uL (ref 1500–7800)
Neutrophils Relative %: 49.6 %
Platelets: 326 10*3/uL (ref 140–400)
RBC: 4.88 10*6/uL (ref 4.20–5.80)
RDW: 12.7 % (ref 11.0–15.0)
Total Lymphocyte: 41.2 %
WBC: 10.8 10*3/uL (ref 3.8–10.8)

## 2022-09-28 LAB — COMPLETE METABOLIC PANEL WITH GFR
AG Ratio: 1.7 (calc) (ref 1.0–2.5)
ALT: 42 U/L (ref 9–46)
AST: 26 U/L (ref 10–40)
Albumin: 4.8 g/dL (ref 3.6–5.1)
Alkaline phosphatase (APISO): 62 U/L (ref 36–130)
BUN/Creatinine Ratio: 20 (calc) (ref 6–22)
BUN: 11 mg/dL (ref 7–25)
CO2: 23 mmol/L (ref 20–32)
Calcium: 9.4 mg/dL (ref 8.6–10.3)
Chloride: 105 mmol/L (ref 98–110)
Creat: 0.56 mg/dL — ABNORMAL LOW (ref 0.60–1.29)
Globulin: 2.9 g/dL (ref 1.9–3.7)
Glucose, Bld: 85 mg/dL (ref 65–99)
Potassium: 4.3 mmol/L (ref 3.5–5.3)
Sodium: 140 mmol/L (ref 135–146)
Total Bilirubin: 0.5 mg/dL (ref 0.2–1.2)
Total Protein: 7.7 g/dL (ref 6.1–8.1)
eGFR: 125 mL/min/{1.73_m2} (ref >=60)

## 2022-09-28 LAB — LIPID PANEL
Cholesterol: 188 mg/dL (ref <200)
HDL: 40 mg/dL (ref >=40)
LDL Cholesterol (Calc): 120 mg/dL — ABNORMAL HIGH
Non-HDL Cholesterol (Calc): 148 mg/dL — ABNORMAL HIGH (ref <130)
Total CHOL/HDL Ratio: 4.7 (calc) (ref <5.0)
Triglycerides: 162 mg/dL — ABNORMAL HIGH (ref <150)

## 2022-09-28 LAB — HIV ANTIBODY (ROUTINE TESTING W REFLEX): HIV 1&2 Ab, 4th Generation: NONREACTIVE

## 2022-09-28 LAB — HEMOGLOBIN A1C
Hgb A1c MFr Bld: 6 %{Hb} — ABNORMAL HIGH (ref <5.7)
Mean Plasma Glucose: 126 mg/dL
eAG (mmol/L): 7 mmol/L

## 2022-09-28 NOTE — Progress Notes (Signed)
-   hgb 13.9, normal, no anemia -  A1C 6.0, ranging as prediabetes -   triglycerides and  LDL elevated , will need to exercise at least 150 minutes/week, eat 3/4 of meals plant-based (vegetables and fruits), low carb diet, will re-check in 3 months -  electrolytes, kidney function and liver enzymes normal

## 2022-10-04 ENCOUNTER — Other Ambulatory Visit: Payer: Self-pay | Admitting: Adult Health

## 2022-10-04 DIAGNOSIS — E782 Mixed hyperlipidemia: Secondary | ICD-10-CM

## 2022-10-04 DIAGNOSIS — R7303 Prediabetes: Secondary | ICD-10-CM

## 2022-10-13 ENCOUNTER — Encounter: Payer: Self-pay | Admitting: Adult Health

## 2022-10-13 ENCOUNTER — Ambulatory Visit (INDEPENDENT_AMBULATORY_CARE_PROVIDER_SITE_OTHER): Payer: Self-pay | Admitting: Adult Health

## 2022-10-13 VITALS — BP 120/88 | HR 88 | Temp 98.8°F | Resp 17 | Wt 154.0 lb

## 2022-10-13 DIAGNOSIS — G959 Disease of spinal cord, unspecified: Secondary | ICD-10-CM | POA: Insufficient documentation

## 2022-10-13 DIAGNOSIS — R7303 Prediabetes: Secondary | ICD-10-CM

## 2022-10-13 DIAGNOSIS — E782 Mixed hyperlipidemia: Secondary | ICD-10-CM

## 2022-10-13 DIAGNOSIS — H04123 Dry eye syndrome of bilateral lacrimal glands: Secondary | ICD-10-CM

## 2022-10-13 DIAGNOSIS — Z23 Encounter for immunization: Secondary | ICD-10-CM

## 2022-10-13 DIAGNOSIS — I10 Essential (primary) hypertension: Secondary | ICD-10-CM

## 2022-10-13 DIAGNOSIS — G1221 Amyotrophic lateral sclerosis: Secondary | ICD-10-CM

## 2022-10-13 DIAGNOSIS — R1310 Dysphagia, unspecified: Secondary | ICD-10-CM

## 2022-10-13 NOTE — Progress Notes (Signed)
[facility] clinic  Provider:  Kenard Gower DNP  Code Status:  Full Code  Goals of Care:     10/13/2022    2:29 PM  Advanced Directives  Does Patient Have a Medical Advance Directive? No  Would patient like information on creating a medical advance directive? No - Patient declined     Chief Complaint  Patient presents with   Medical Management of Chronic Issues    2 week follow up   Immunizations    Patient flu vaccine , covid vaccine , tdap    Health Maintenance    Patient is due for colonoscopy    HPI: Patient is a 47 y.o. male seen today for a 2-week follow up of chronic medical issues. He was accompanied today by his wife, Daughter and a Spanish interpreter.   Mixed hyperlipidemia  -  chol 188, triglycerides 162, LDL 120, eats Tamales which is cooked with lard  Prediabetes  -  A1C 6.0, not on medications  ALS (amyotrophic lateral sclerosis) (HCC) -  not able to move BUE  Dysphagia, unspecified type -  has occasional chocking  Dry eyes -  uses Artificial tears PRN  Primary hypertension  -  BP 120/88, not on medication  Past Medical History:  Diagnosis Date   ALS (amyotrophic lateral sclerosis) (HCC)    High cholesterol    Hypertension     Past Surgical History:  Procedure Laterality Date   APPENDECTOMY      Allergies  Allergen Reactions   Contrast Media [Iodinated Contrast Media] Nausea And Vomiting    Outpatient Encounter Medications as of 10/13/2022  Medication Sig   Acetaminophen (TYLENOL PO) Take by mouth as needed.   Artificial Tears ophthalmic solution Place 1 drop into both eyes 3 (three) times daily as needed.   No facility-administered encounter medications on file as of 10/13/2022.    Review of Systems:  Review of Systems  Constitutional:  Negative for activity change, appetite change and fever.  HENT:  Negative for sore throat.   Eyes: Negative.   Respiratory:  Positive for choking. Negative for cough.   Cardiovascular:  Negative  for chest pain and leg swelling.  Gastrointestinal:  Negative for abdominal distention, diarrhea and vomiting.  Genitourinary:  Negative for dysuria, frequency and urgency.  Skin:  Negative for color change.  Neurological:  Negative for dizziness and headaches.  Psychiatric/Behavioral:  Negative for behavioral problems and sleep disturbance. The patient is not nervous/anxious.     Health Maintenance  Topic Date Due   DTaP/Tdap/Td (1 - Tdap) Never done   INFLUENZA VACCINE  09/01/2022   COVID-19 Vaccine (1 - 2023-24 season) Never done   Colonoscopy  Never done   Hepatitis C Screening  Completed   HIV Screening  Completed   HPV VACCINES  Aged Out    Physical Exam: Vitals:   10/13/22 1430  BP: 120/88  Pulse: 88  Resp: 17  Temp: 98.8 F (37.1 C)  TempSrc: Temporal  SpO2: 97%  Weight: 154 lb (69.9 kg)   Body mass index is 26.43 kg/m. Physical Exam Constitutional:      Appearance: Normal appearance.  HENT:     Head: Normocephalic and atraumatic.     Mouth/Throat:     Mouth: Mucous membranes are moist.  Eyes:     Conjunctiva/sclera: Conjunctivae normal.  Cardiovascular:     Rate and Rhythm: Normal rate and regular rhythm.     Pulses: Normal pulses.     Heart sounds: Normal heart  sounds.  Pulmonary:     Effort: Pulmonary effort is normal.     Breath sounds: Normal breath sounds.  Abdominal:     General: Bowel sounds are normal.     Palpations: Abdomen is soft.  Musculoskeletal:        General: No swelling.     Cervical back: Normal range of motion.     Comments: Cannot move BUE  Skin:    General: Skin is warm and dry.  Neurological:     Mental Status: He is alert and oriented to person, place, and time. Mental status is at baseline.  Psychiatric:        Mood and Affect: Mood normal.        Behavior: Behavior normal.        Thought Content: Thought content normal.        Judgment: Judgment normal.     Labs reviewed: Basic Metabolic Panel: Recent Labs     09/27/22 0908  NA 140  K 4.3  CL 105  CO2 23  GLUCOSE 85  BUN 11  CREATININE 0.56*  CALCIUM 9.4   Liver Function Tests: Recent Labs    09/27/22 0908  AST 26  ALT 42  BILITOT 0.5  PROT 7.7   No results for input(s): "LIPASE", "AMYLASE" in the last 8760 hours. No results for input(s): "AMMONIA" in the last 8760 hours. CBC: Recent Labs    09/27/22 0908  WBC 10.8  NEUTROABS 5,357  HGB 13.9  HCT 41.1  MCV 84.2  PLT 326   Lipid Panel: Recent Labs    09/27/22 0908  CHOL 188  HDL 40  LDLCALC 120*  TRIG 162*  CHOLHDL 4.7   Lab Results  Component Value Date   HGBA1C 6.0 (H) 09/27/2022    Procedures since last visit: No results found.  Assessment/Plan  1. Mixed hyperlipidemia Lab Results  Component Value Date   CHOL 188 09/27/2022   HDL 40 09/27/2022   LDLCALC 120 (H) 09/27/2022   TRIG 162 (H) 09/27/2022   CHOLHDL 4.7 09/27/2022    -  instructed to avoid  foods with fat additives such as tamales -  instructed to doe 150 minutes of exercise/week  2. Prediabetes Lab Results  Component Value Date   HGBA1C 6.0 (H) 09/27/2022   -  instructed to exercise at least 150 minutes/week  3. ALS (amyotrophic lateral sclerosis) (HCC) -  follows up with neurology at [facility] -   fall precautions  4. Dysphagia, unspecified type -   cut up food into small pieces -  aspiration precautions  5. Dry eyes -  continue Artificial tears PRN  6. Flu vaccine need - Flu vaccine trivalent PF, 6mos and older(Flulaval,Afluria,Fluarix,Fluzone)  7. Primary hypertension Stable, not on medication    Labs/tests ordered:    None   Next appt:  01/03/2023

## 2023-01-03 ENCOUNTER — Other Ambulatory Visit: Payer: Self-pay

## 2023-01-03 DIAGNOSIS — E782 Mixed hyperlipidemia: Secondary | ICD-10-CM

## 2023-01-03 DIAGNOSIS — R7303 Prediabetes: Secondary | ICD-10-CM

## 2023-01-12 ENCOUNTER — Ambulatory Visit: Payer: Self-pay | Admitting: Adult Health

## 2023-01-12 ENCOUNTER — Encounter: Payer: Self-pay | Admitting: Adult Health

## 2023-01-12 VITALS — BP 121/88 | HR 93 | Temp 98.2°F | Resp 18 | Ht 64.0 in | Wt 154.4 lb

## 2023-01-12 DIAGNOSIS — R1310 Dysphagia, unspecified: Secondary | ICD-10-CM

## 2023-01-12 DIAGNOSIS — D72829 Elevated white blood cell count, unspecified: Secondary | ICD-10-CM

## 2023-01-12 DIAGNOSIS — R7303 Prediabetes: Secondary | ICD-10-CM

## 2023-01-12 DIAGNOSIS — G1221 Amyotrophic lateral sclerosis: Secondary | ICD-10-CM

## 2023-01-12 DIAGNOSIS — E782 Mixed hyperlipidemia: Secondary | ICD-10-CM

## 2023-01-12 NOTE — Progress Notes (Signed)
[facility] clinic  Provider:  Kenard Gower DNP  Code Status:  Full Code  Goals of Care:     10/13/2022    2:29 PM  Advanced Directives  Does Patient Have a Medical Advance Directive? No  Would patient like information on creating a medical advance directive? No - Patient declined     Chief Complaint  Patient presents with   Medical Management of Chronic Issues    3 months follow-up   Immunizations    Covid and DTAP   Health Maintenance    Colonoscopy    HPI: Patient is a 47 y.o. male seen today for a 11-month follow-up of chronic medical issues.  He was accompanied today by a [place] interpreter, wife and daughter.  Medical insurance has not been approved yet per wife.  ALS (amyotrophic lateral sclerosis) (HCC) -  not able to move BUE, follows up with [facility]rology 12/18, has blurry vision  Mixed hyperlipidemia -  not on statin  Prediabetes -  A1C 6.0, not on medication  Dysphagia -  smaller bites of food, wife feeds him all meals, she goes on her break from work and feeds him lunch since he is not able to move BUE  Wt Readings from Last 3 Encounters:  01/12/23 154 lb 6.4 oz (70 kg)  10/13/22 154 lb (69.9 kg)  09/26/22 155 lb 12.8 oz (70.7 kg)     Past Medical History:  Diagnosis Date   ALS (amyotrophic lateral sclerosis) (HCC)    High cholesterol    Hypertension     Past Surgical History:  Procedure Laterality Date   APPENDECTOMY      Allergies  Allergen Reactions   Contrast Media [Iodinated Contrast Media] Nausea And Vomiting    Outpatient Encounter Medications as of 01/12/2023  Medication Sig   Acetaminophen (TYLENOL PO) Take 500 mg by mouth daily as needed.   Artificial Tears ophthalmic solution Place 1 drop into both eyes 3 (three) times daily as needed.   No facility-administered encounter medications on file as of 01/12/2023.    Review of Systems:  Review of Systems  Constitutional:  Negative for activity change, appetite  change and fever.  HENT:  Negative for sore throat.   Eyes: Negative.   Cardiovascular:  Negative for chest pain and leg swelling.  Gastrointestinal:  Negative for abdominal distention, diarrhea and vomiting.  Genitourinary:  Negative for dysuria, frequency and urgency.  Skin:  Negative for color change.  Neurological:  Negative for dizziness and headaches.  Psychiatric/Behavioral:  Negative for behavioral problems and sleep disturbance. The patient is not nervous/anxious.     Health Maintenance  Topic Date Due   DTaP/Tdap/Td (1 - Tdap) Never done   COVID-19 Vaccine (1 - 2024-25 season) Never done   Colonoscopy  Never done   INFLUENZA VACCINE  Completed   Hepatitis C Screening  Completed   HIV Screening  Completed   HPV VACCINES  Aged Out    Physical Exam: Vitals:   01/12/23 1358  BP: 121/88  Pulse: 93  Resp: 18  Temp: 98.2 F (36.8 C)  SpO2: 98%  Weight: 154 lb 6.4 oz (70 kg)  Height: 5\' 4"  (1.626 m)   Body mass index is 26.5 kg/m. Physical Exam Constitutional:      Appearance: Normal appearance.  HENT:     Head: Normocephalic and atraumatic.     Mouth/Throat:     Mouth: Mucous membranes are moist.  Eyes:     Conjunctiva/sclera: Conjunctivae normal.  Cardiovascular:  Rate and Rhythm: Normal rate and regular rhythm.     Pulses: Normal pulses.     Heart sounds: Normal heart sounds.  Pulmonary:     Effort: Pulmonary effort is normal.     Breath sounds: Normal breath sounds.  Abdominal:     General: Bowel sounds are normal.     Palpations: Abdomen is soft.  Musculoskeletal:        General: No swelling.     Cervical back: Normal range of motion.     Comments: Not able to move bilateral upper extremities  Skin:    General: Skin is warm and dry.  Neurological:     General: No focal deficit present.     Mental Status: He is alert and oriented to person, place, and time.  Psychiatric:        Mood and Affect: Mood normal.        Behavior: Behavior normal.         Thought Content: Thought content normal.        Judgment: Judgment normal.     Labs reviewed: Basic Metabolic Panel: Recent Labs    09/27/22 0908  NA 140  K 4.3  CL 105  CO2 23  GLUCOSE 85  BUN 11  CREATININE 0.56*  CALCIUM 9.4   Liver Function Tests: Recent Labs    09/27/22 0908  AST 26  ALT 42  BILITOT 0.5  PROT 7.7   No results for input(s): "LIPASE", "AMYLASE" in the last 8760 hours. No results for input(s): "AMMONIA" in the last 8760 hours. CBC: Recent Labs    09/27/22 0908  WBC 10.8  NEUTROABS 5,357  HGB 13.9  HCT 41.1  MCV 84.2  PLT 326   Lipid Panel: Recent Labs    09/27/22 0908  CHOL 188  HDL 40  LDLCALC 120*  TRIG 162*  CHOLHDL 4.7   Lab Results  Component Value Date   HGBA1C 6.0 (H) 09/27/2022    Procedures since last visit: No results found.  Assessment/Plan  1. ALS (amyotrophic lateral sclerosis) (HCC) (Primary) -Follows up with neurology - CBC with Differential/Platelets; Future  2. Mixed hyperlipidemia Lab Results  Component Value Date   CHOL 188 09/27/2022   HDL 40 09/27/2022   LDLCALC 120 (H) 09/27/2022   TRIG 162 (H) 09/27/2022   CHOLHDL 4.7 09/27/2022    -  encouraged to walk outside with wife's assistance  3. Prediabetes Lab Results  Component Value Date   HGBA1C 6.0 (H) 09/27/2022    -Diet controlled  4. Dysphagia, unspecified type -  Eats small bites -   Aspiration precautions    Labs/tests ordered: CBC  Next appt:  Visit date not found

## 2023-01-17 ENCOUNTER — Other Ambulatory Visit: Payer: Self-pay

## 2023-01-17 DIAGNOSIS — G1221 Amyotrophic lateral sclerosis: Secondary | ICD-10-CM

## 2023-01-18 LAB — CBC WITH DIFFERENTIAL/PLATELET
Absolute Lymphocytes: 5900 {cells}/uL — ABNORMAL HIGH (ref 850–3900)
Absolute Monocytes: 766 {cells}/uL (ref 200–950)
Basophils Absolute: 92 {cells}/uL (ref 0–200)
Basophils Relative: 0.7 %
Eosinophils Absolute: 396 {cells}/uL (ref 15–500)
Eosinophils Relative: 3 %
HCT: 41.4 % (ref 38.5–50.0)
Hemoglobin: 13.8 g/dL (ref 13.2–17.1)
MCH: 28.2 pg (ref 27.0–33.0)
MCHC: 33.3 g/dL (ref 32.0–36.0)
MCV: 84.5 fL (ref 80.0–100.0)
MPV: 10.5 fL (ref 7.5–12.5)
Monocytes Relative: 5.8 %
Neutro Abs: 6046 {cells}/uL (ref 1500–7800)
Neutrophils Relative %: 45.8 %
Platelets: 317 10*3/uL (ref 140–400)
RBC: 4.9 10*6/uL (ref 4.20–5.80)
RDW: 13 % (ref 11.0–15.0)
Total Lymphocyte: 44.7 %
WBC: 13.2 10*3/uL — ABNORMAL HIGH (ref 3.8–10.8)

## 2023-01-20 NOTE — Progress Notes (Signed)
-  WBC elevated, will need to recheck CBC in 1 week.

## 2023-02-10 ENCOUNTER — Other Ambulatory Visit: Payer: Self-pay

## 2023-02-10 DIAGNOSIS — D72829 Elevated white blood cell count, unspecified: Secondary | ICD-10-CM

## 2023-02-15 ENCOUNTER — Other Ambulatory Visit: Payer: Self-pay

## 2023-02-15 LAB — CBC WITH DIFFERENTIAL/PLATELET
Absolute Lymphocytes: 4862 {cells}/uL — ABNORMAL HIGH (ref 850–3900)
Absolute Monocytes: 544 {cells}/uL (ref 200–950)
Basophils Absolute: 111 {cells}/uL (ref 0–200)
Basophils Relative: 1 %
Eosinophils Absolute: 366 {cells}/uL (ref 15–500)
Eosinophils Relative: 3.3 %
HCT: 43.8 % (ref 38.5–50.0)
Hemoglobin: 14.7 g/dL (ref 13.2–17.1)
MCH: 27.7 pg (ref 27.0–33.0)
MCHC: 33.6 g/dL (ref 32.0–36.0)
MCV: 82.6 fL (ref 80.0–100.0)
MPV: 10.4 fL (ref 7.5–12.5)
Monocytes Relative: 4.9 %
Neutro Abs: 5217 {cells}/uL (ref 1500–7800)
Neutrophils Relative %: 47 %
Platelets: 344 10*3/uL (ref 140–400)
RBC: 5.3 10*6/uL (ref 4.20–5.80)
RDW: 12.8 % (ref 11.0–15.0)
Total Lymphocyte: 43.8 %
WBC: 11.1 10*3/uL — ABNORMAL HIGH (ref 3.8–10.8)

## 2023-02-16 NOTE — Progress Notes (Signed)
-    WBC 11.1, down from 13.2, good

## 2023-04-13 ENCOUNTER — Encounter: Payer: Self-pay | Admitting: Adult Health

## 2023-04-13 NOTE — Progress Notes (Signed)
 This encounter was created in error - please disregard.
